# Patient Record
Sex: Female | Born: 1970 | Race: White | Hispanic: No | Marital: Married | Smoking: Never smoker
Health system: Southern US, Community
[De-identification: ages and names within clinical notes are randomized; demographics above are authoritative.]

## PROBLEM LIST (undated history)

## (undated) DIAGNOSIS — Z8742 Personal history of other diseases of the female genital tract: Secondary | ICD-10-CM

## (undated) HISTORY — PX: WISDOM TOOTH EXTRACTION: SHX21

## (undated) HISTORY — PX: INTRAUTERINE DEVICE (IUD) INSERTION: SHX5877

## (undated) HISTORY — PX: TONSILLECTOMY: SUR1361

## (undated) HISTORY — PX: COLPOSCOPY: SHX161

## (undated) HISTORY — DX: Personal history of other diseases of the female genital tract: Z87.42

---

## 2005-09-12 ENCOUNTER — Emergency Department: Payer: Self-pay | Admitting: Emergency Medicine

## 2006-05-21 ENCOUNTER — Ambulatory Visit: Payer: Self-pay | Admitting: Licensed Clinical Social Worker

## 2006-05-28 ENCOUNTER — Ambulatory Visit: Payer: Self-pay | Admitting: Licensed Clinical Social Worker

## 2006-06-04 ENCOUNTER — Ambulatory Visit: Payer: Self-pay | Admitting: Licensed Clinical Social Worker

## 2006-06-11 ENCOUNTER — Ambulatory Visit: Payer: Self-pay | Admitting: Licensed Clinical Social Worker

## 2006-07-02 ENCOUNTER — Ambulatory Visit: Payer: Self-pay | Admitting: Licensed Clinical Social Worker

## 2006-07-11 ENCOUNTER — Ambulatory Visit: Payer: Self-pay | Admitting: Licensed Clinical Social Worker

## 2006-07-17 ENCOUNTER — Ambulatory Visit: Payer: Self-pay | Admitting: Licensed Clinical Social Worker

## 2007-06-15 ENCOUNTER — Other Ambulatory Visit: Payer: Self-pay

## 2007-06-15 ENCOUNTER — Emergency Department: Payer: Self-pay | Admitting: Emergency Medicine

## 2007-08-22 ENCOUNTER — Other Ambulatory Visit: Admission: RE | Admit: 2007-08-22 | Discharge: 2007-08-22 | Payer: Self-pay | Admitting: Obstetrics & Gynecology

## 2007-10-03 ENCOUNTER — Emergency Department: Payer: Self-pay | Admitting: Emergency Medicine

## 2009-01-13 ENCOUNTER — Other Ambulatory Visit: Admission: RE | Admit: 2009-01-13 | Discharge: 2009-01-13 | Payer: Self-pay | Admitting: Obstetrics and Gynecology

## 2010-10-12 ENCOUNTER — Encounter
Admission: RE | Admit: 2010-10-12 | Discharge: 2010-10-12 | Payer: Self-pay | Source: Home / Self Care | Attending: Obstetrics and Gynecology | Admitting: Obstetrics and Gynecology

## 2013-08-11 ENCOUNTER — Encounter (HOSPITAL_COMMUNITY): Payer: Self-pay | Admitting: Emergency Medicine

## 2013-08-11 ENCOUNTER — Emergency Department (INDEPENDENT_AMBULATORY_CARE_PROVIDER_SITE_OTHER)
Admission: EM | Admit: 2013-08-11 | Discharge: 2013-08-11 | Disposition: A | Discharge status: Home / Self Care | Payer: Self-pay | Source: Home / Self Care | Attending: Emergency Medicine | Admitting: Emergency Medicine

## 2013-08-11 DIAGNOSIS — L089 Local infection of the skin and subcutaneous tissue, unspecified: Secondary | ICD-10-CM

## 2013-08-11 MED ORDER — SULFAMETHOXAZOLE-TRIMETHOPRIM 800-160 MG PO TABS: 1.0000 | ORAL_TABLET | Freq: Two times a day (BID) | ORAL | Status: DC

## 2013-08-11 NOTE — ED Notes (Signed)
C/o left great toe infection x 1wk. Swelling and redness. Pt has used neosporin and peroxide with no relief.

## 2013-08-11 NOTE — ED Provider Notes (Signed)
CSN: [id number]     Arrival date & time 08/11/13  0907 History   First MD Initiated Contact with Patient 08/11/13 [phone]     Chief Complaint  Patient presents with  . Recurrent Skin Infections    infection of left great toe.    (Consider location/radiation/quality/duration/timing/severity/associated sxs/prior Treatment) Patient is a 42 y.o. female presenting with toe pain. The history is provided by the patient. No language interpreter was used.  Toe Pain This is a new problem. The problem occurs constantly. The problem has been gradually worsening. Nothing aggravates the symptoms. Nothing relieves the symptoms. She has tried water for the symptoms. The treatment provided no relief.  Pt complains of infection to left 1st toe  History reviewed. No pertinent past medical history. History reviewed. No pertinent past surgical history. History reviewed. No pertinent family history. History  Substance Use Topics  . Smoking status: Never Smoker   . Smokeless tobacco: Not on file  . Alcohol Use: Yes   OB History   Grav Para Term Preterm Abortions TAB SAB Ect Mult Living                 Review of Systems  Skin: Positive for wound.  All other systems reviewed and are negative.    Allergies  Review of patient's allergies indicates no known allergies.  Home Medications  No current outpatient prescriptions on file. BP 142/84  Pulse 77  Temp(Src) 97.7 F (36.5 C) (Oral)  Resp 14  SpO2 10%  LMP 08/11/2013 Physical Exam  Nursing note and vitals reviewed. Constitutional: She appears well-developed and well-nourished.  Musculoskeletal: She exhibits tenderness.  Swollen right lateral 1st toe  Neurological: She is alert.  Skin: Skin is warm.    ED Course  Procedures (including critical care time) Labs Review Labs Reviewed - No data to display Imaging Review No results found.  EKG Interpretation     Ventricular Rate:    PR Interval:    QRS Duration:   QT Interval:     QTC Calculation:   R Axis:     Text Interpretation:              MDM   1. Toe infection     Soak 20 minutes 4 times a day,  Bactrim     Elson Areas, PA-C 08/11/13 1008  [employer], [place] 08/11/13 1008

## 2013-08-13 NOTE — ED Provider Notes (Signed)
Medical screening examination/treatment/procedure(s) were performed by non-physician practitioner and as supervising physician I was immediately available for consultation/collaboration.  Shaolin Armas, M.D.  Yurianna Tusing C Buena Boehm, MD 08/13/13 1136 

## 2013-08-18 ENCOUNTER — Encounter: Payer: Self-pay | Admitting: Podiatry

## 2013-08-18 ENCOUNTER — Ambulatory Visit (INDEPENDENT_AMBULATORY_CARE_PROVIDER_SITE_OTHER): Payer: Self-pay | Admitting: Podiatry

## 2013-08-18 VITALS — BP 130/85 | HR 74 | Resp 24 | Ht 67.0 in | Wt 130.0 lb

## 2013-08-18 DIAGNOSIS — L600 Ingrowing nail: Secondary | ICD-10-CM

## 2013-08-18 MED ORDER — HYDROCODONE-ACETAMINOPHEN 10-325 MG PO TABS: 1.0000 | ORAL_TABLET | Freq: Three times a day (TID) | ORAL | Status: DC | PRN

## 2013-08-18 NOTE — Progress Notes (Signed)
  Subjective:    Patient ID: Natalie Weber, female    DOB: 02-02-1971, 42 y.o.   MRN: [id number] "I have an infection on my left big toenail."   HPI Comments: N  Throb, ache, tender, sore, pus, and bleeding L   Ingrown hallux left D  2 weeks O  Suddenly, I had a pedicure C  Gotten little better A  Shoes T  Sulfamethoxazole, soaking, neosporin, bandaids      Review of Systems  Constitutional: Negative.   HENT: Negative.   Eyes: Negative.   Respiratory: Negative.   Cardiovascular: Negative.   Gastrointestinal: Negative.   Endocrine: Negative.   Genitourinary: Negative.   Musculoskeletal: Negative.   Skin: Negative.   Allergic/Immunologic: Negative.   Neurological: Negative.   Hematological: Negative.   Psychiatric/Behavioral: Negative.        Objective:   Physical Exam 42 year old white female orientated x3 presents with her boyfriend.  Vascular: The DP and PT pulses are two over four bilaterally capillary fill is immediate bilaterally.  Neurological: Sensation intact bilaterally  Dermatological: Incurvation of the medial margin of the left hallux toenail, with low-grade erythema edema.  Musculoskeletal: No deformities noted bilaterally        Assessment & Plan:   Assessment: Ingrowing medial margin of the left hallux toenail with low-grade paronychia.  Plan: Offered patient permanent removal of the margin on the left hallux toenail, she verbally consents to the procedure. The left hallux was then blocked with 3 cc 50-50 mixture of 2% plain Xylocaine and 0.5% plain Marcaine. The left hallux was prepped with Betadine and exsanguinated. The medial margin of the left hallux toenail was excised and a phenol matricectomy performed. An antibiotic dressing was applied. The tourniquet was released and spontaneous capillary filling time noted on the left hallux.  Postoperative oral redness structures provided. Hydrocodone 10 mg/325 dispense #10. Take one every 8  hours as needed for pain. Reappoint at patient's request.  Richard C.Leeanne Deed, DPM

## 2013-08-18 NOTE — Patient Instructions (Signed)

## 2014-01-28 ENCOUNTER — Other Ambulatory Visit: Payer: Self-pay

## 2014-01-28 ENCOUNTER — Encounter: Payer: Self-pay | Admitting: Certified Nurse Midwife

## 2014-01-28 ENCOUNTER — Ambulatory Visit (INDEPENDENT_AMBULATORY_CARE_PROVIDER_SITE_OTHER): Payer: No Typology Code available for payment source | Admitting: Certified Nurse Midwife

## 2014-01-28 VITALS — BP 118/80 | HR 80 | Ht 66.0 in | Wt 134.0 lb

## 2014-01-28 DIAGNOSIS — Z1231 Encounter for screening mammogram for malignant neoplasm of breast: Secondary | ICD-10-CM

## 2014-01-28 DIAGNOSIS — Z0000 Encounter for general adult medical examination without abnormal findings: Secondary | ICD-10-CM

## 2014-01-28 DIAGNOSIS — Z01419 Encounter for gynecological examination (general) (routine) without abnormal findings: Secondary | ICD-10-CM

## 2014-01-28 LAB — LIPID PANEL
CHOL/HDL RATIO: 2.1 ratio
CHOLESTEROL: 169 mg/dL (ref 0–200)
HDL: 79 mg/dL (ref >39)
LDL Cholesterol: 47 mg/dL (ref 0–99)
TRIGLYCERIDES: 213 mg/dL — AB (ref <150)
VLDL: 43 mg/dL — AB (ref 0–40)

## 2014-01-28 LAB — CBC
HCT: 40.2 % (ref 36.0–46.0)
HEMOGLOBIN: 13.8 g/dL (ref 12.0–15.0)
MCH: 31.4 pg (ref 26.0–34.0)
MCHC: 34.3 g/dL (ref 30.0–36.0)
MCV: 91.6 fL (ref 78.0–100.0)
PLATELETS: 239 10*3/uL (ref 150–400)
RBC: 4.39 MIL/uL (ref 3.87–5.11)
RDW: 13.6 % (ref 11.5–15.5)
WBC: 5.5 10*3/uL (ref 4.0–10.5)

## 2014-01-28 LAB — COMPREHENSIVE METABOLIC PANEL
ALK PHOS: 51 U/L (ref 39–117)
ALT: 10 U/L (ref 0–35)
AST: 14 U/L (ref 0–37)
Albumin: 4.3 g/dL (ref 3.5–5.2)
BILIRUBIN TOTAL: 0.5 mg/dL (ref 0.2–1.2)
BUN: 13 mg/dL (ref 6–23)
CO2: 27 mEq/L (ref 19–32)
CREATININE: 0.72 mg/dL (ref 0.50–1.10)
Calcium: 9.6 mg/dL (ref 8.4–10.5)
Chloride: 101 mEq/L (ref 96–112)
Glucose, Bld: 68 mg/dL — ABNORMAL LOW (ref 70–99)
POTASSIUM: 4.1 meq/L (ref 3.5–5.3)
SODIUM: 138 meq/L (ref 135–145)
Total Protein: 6.7 g/dL (ref 6.0–8.3)

## 2014-01-28 LAB — POCT URINALYSIS DIPSTICK
BILIRUBIN UA: NEGATIVE
Blood, UA: NEGATIVE
GLUCOSE UA: NEGATIVE
KETONES UA: NEGATIVE
LEUKOCYTES UA: NEGATIVE
Nitrite, UA: NEGATIVE
PH UA: 8
Protein, UA: NEGATIVE
Urobilinogen, UA: NEGATIVE

## 2014-01-28 NOTE — Progress Notes (Signed)
Reviewed personally.  M. Suzanne Khristi Schiller, MD.  

## 2014-01-28 NOTE — Patient Instructions (Addendum)

## 2014-01-28 NOTE — Progress Notes (Signed)
Patient ID: Natalie Weber, female   DOB: [birth year], 43 y.o.   MRN: [id number] 43 y.o. G70P3003 Married Caucasian Fe here for annual exam. Periods normal, no issues. Contraception none. Would like to discuss options. No health issues over past year. Sees Urgent Care prn. No health issues today.   No LMP recorded.          Sexually active: yes  The current method of family planning is none.    Exercising: yes  Home exercise routine includes running and walking. Smoker:  no  Health Maintenance: Pap:  2 years ago MMG:  10/14/10, Bi-Rads 1: negative TDaP:  UTD Labs: HB:  Declined Urine:  Negative    reports that she has never smoked. She has never used smokeless tobacco. She reports that she drinks about 1.5 ounces of alcohol per week. She reports that she does not use illicit drugs.  Past Medical History  Diagnosis Date  . History of abnormal cervical Pap smear     colpo    Past Surgical History  Procedure Laterality Date  . Tonsillectomy    . Wisdom tooth extraction    . Colposcopy      No current outpatient prescriptions on file.   No current facility-administered medications for this visit.    Family History  Problem Relation Age of Onset  . Hypertension Mother   . Hypertension Father     ROS:  Pertinent items are noted in HPI.  Otherwise, a comprehensive ROS was negative.  Exam:   BP 118/80  Pulse 80  Ht 5\' 6"  (1.676 m)  Wt 134 lb (60.782 kg)  BMI 21.64 kg/m2 Height: 5\' 6"  (167.6 cm)  Ht Readings from Last 3 Encounters:  01/28/14 5\' 6"  (1.676 m)  08/18/13 5\' 7"  (1.702 m)    General appearance: alert, cooperative and appears stated age Head: Normocephalic, without obvious abnormality, atraumatic Neck: no adenopathy, supple, symmetrical, trachea midline and thyroid normal to inspection and palpation and non-palpable Lungs: clear to auscultation bilaterally Breasts: normal appearance, no masses or tenderness, No nipple retraction or dimpling, No nipple  discharge or bleeding, No axillary or supraclavicular adenopathy Heart: regular rate and rhythm Abdomen: soft, non-tender; no masses,  no organomegaly Extremities: extremities normal, atraumatic, no cyanosis or edema Skin: Skin color, texture, turgor normal. No rashes or lesions Lymph nodes: Cervical, supraclavicular, and axillary nodes normal. No abnormal inguinal nodes palpated Neurologic: Grossly normal   Pelvic: External genitalia:  no lesions              Urethra:  normal appearing urethra with no masses, tenderness or lesions              Bartholin's and Skene's: normal                 Vagina: normal appearing vagina with normal color and discharge, no lesions              Cervix: normal, non tender              Pap taken: yes Bimanual Exam:  Uterus:  normal size, contour, position, consistency, mobility, non-tender and anteverted              Adnexa: normal adnexa and no mass, fullness, tenderness               Rectovaginal: Confirms               Anus:  normal sphincter tone, no lesions  A:  Well Woman with  normal exam  Contraception withdrawal and NFP, interested in Mirena IUD  Screening labs  P:   Reviewed health and wellness pertinent to exam  Discussed risks and benefits of Mirena IUD, insertion, removal, bleeding profile. Given info to check with insurance company and if decides for insertion will need to use condoms month prior and call to let us know she desires IUD for precert. She will need to call on menses for insertion.   Labs:CBC,CMP,Lipid panel,TSH  Pap smear as per guidelines   Mammogram yearly, information given to schedule pap smear taken today with HPVHR  counseled on breast self exam, mammography screening, family planning choices, adequate intake of calcium and vitamin D, diet and exercise  return annually or prn  An After Visit Summary was printed and given to the patient.

## 2014-01-29 ENCOUNTER — Other Ambulatory Visit: Payer: Self-pay | Admitting: Certified Nurse Midwife

## 2014-01-29 DIAGNOSIS — R6889 Other general symptoms and signs: Secondary | ICD-10-CM

## 2014-01-29 LAB — TSH: TSH: 1.595 u[IU]/mL (ref 0.350–4.500)

## 2014-02-02 LAB — IPS PAP TEST WITH HPV

## 2014-02-03 ENCOUNTER — Telehealth: Payer: Self-pay | Admitting: Emergency Medicine

## 2014-02-03 ENCOUNTER — Other Ambulatory Visit: Payer: Self-pay | Admitting: Certified Nurse Midwife

## 2014-02-03 DIAGNOSIS — IMO0002 Reserved for concepts with insufficient information to code with codable children: Secondary | ICD-10-CM

## 2014-02-03 DIAGNOSIS — R8789 Other abnormal findings in specimens from female genital organs: Secondary | ICD-10-CM

## 2014-02-03 DIAGNOSIS — R87618 Other abnormal cytological findings on specimens from cervix uteri: Secondary | ICD-10-CM

## 2014-02-03 NOTE — Telephone Encounter (Signed)
Message copied by Joeseph AmorFAST, Korde Jeppsen L on Tue Feb 03, 2014 11:29 AM ------      Message from: Verner CholLEONARD, DEBORAH S      Created: Tue Feb 03, 2014  8:24 AM       Notify patient her pap smear showed HPVHR detected. Colposcopy recommended for evaluation. She has had previous colposcopy for abnormal pap in past.      Order in ------

## 2014-02-03 NOTE — Telephone Encounter (Signed)
Mailed the In-Office procedure form that includes appointment date and time, patient copay, and cancellation policy. °

## 2014-02-03 NOTE — Telephone Encounter (Signed)
Spoke with patient and message from Verner Choleborah S. Leonard CNM given. Patient verbalized understanding and agreeable to colposcopy.  Not currently on any birth control and states she is sexually active. Expecting her cycle to be the last week in April. Scheduled colposcopy for 02/26/14 (patient requests AM appointment) Procedure time blocked. Colposcopy pre-procedure instructions given. Motrin instructions given. Motrin=Advil=Ibuprofen Can take 800 mg (Can purchase over the counter, you will need four 200 mg pills) every 8 hours as needed.  Take with food. Make sure to eat a meal before appointment and drink plenty of fluids. Patient verbalized understanding and will call to reschedule if will be on menses or has any concerns regarding pregnancy. Advised will need to cancel within 24 hours or will have $100.00 no show fee placed to account. Patient agreeable and verbalized understanding to instructions.   Routing to provider for final review. Patient agreeable to disposition. Will close encounter

## 2014-02-25 ENCOUNTER — Ambulatory Visit
Admission: RE | Admit: 2014-02-25 | Discharge: 2014-02-25 | Disposition: A | Discharge status: Home / Self Care | Payer: No Typology Code available for payment source | Source: Ambulatory Visit

## 2014-02-25 DIAGNOSIS — Z1231 Encounter for screening mammogram for malignant neoplasm of breast: Secondary | ICD-10-CM

## 2014-02-26 ENCOUNTER — Ambulatory Visit (INDEPENDENT_AMBULATORY_CARE_PROVIDER_SITE_OTHER): Payer: No Typology Code available for payment source | Admitting: Certified Nurse Midwife

## 2014-02-26 ENCOUNTER — Encounter: Payer: Self-pay | Admitting: Certified Nurse Midwife

## 2014-02-26 VITALS — BP 118/64 | HR 68 | Resp 16 | Ht 66.0 in | Wt 132.0 lb

## 2014-02-26 DIAGNOSIS — B977 Papillomavirus as the cause of diseases classified elsewhere: Secondary | ICD-10-CM

## 2014-02-26 DIAGNOSIS — R8789 Other abnormal findings in specimens from female genital organs: Secondary | ICD-10-CM

## 2014-02-26 DIAGNOSIS — Z0189 Encounter for other specified special examinations: Secondary | ICD-10-CM

## 2014-02-26 DIAGNOSIS — R87618 Other abnormal cytological findings on specimens from cervix uteri: Secondary | ICD-10-CM

## 2014-02-26 LAB — POCT URINE PREGNANCY: PREG TEST UR: NEGATIVE

## 2014-02-26 NOTE — Patient Instructions (Signed)

## 2014-02-26 NOTE — Progress Notes (Signed)
43 y.o. Divorced Caucasian female [phone] here for colposcopy exam. Patient sexually active with no protection. LMP 4/23 or 24/15. Last sexual activity was 02/25/14 without protection. She is currently day 13 or 14 of 28 day cycle. Discussed concerns with colposcopy exam and possible pregnancy. Patient had planned IUD, but needed colpo first. Patient will reschedule today and will use consistent contraception or abstinence. Patient will be called to reschedule. Patient voiced understanding.  O: Healthy WD,WN female Affect: normal   A:Pap smear normal with +HPVHR, past history of abnormal history with Cryo Unprotected sexual activity at ovulatory time in cycle unable to do colpo   P: Patient will follow same instructions and will keep appointment for evaluation. IUD to be scheduled.  RV, appointment 03/24/14 @ 2pm

## 2014-02-26 NOTE — Telephone Encounter (Signed)
Per Verner Choleborah S. Leonard CNM to r/s patient for colpscopy for 03/24/14. Patient scheduled for procedure at 1400. States she understands instructions.

## 2014-02-26 NOTE — Addendum Note (Signed)
Addended by: Joeseph AmorFAST, Leatta Alewine L on: 02/26/2014 12:06 PM   Modules accepted: Orders

## 2014-02-26 NOTE — Progress Notes (Signed)
Reviewed personally.  M. Suzanne Rito Lecomte, MD.  

## 2014-02-26 NOTE — Progress Notes (Signed)
Pt took 400mg  ibuprofen at 9am. Pt had previous colpo done. 01-28-14 negative pap with +HPV

## 2014-03-04 ENCOUNTER — Other Ambulatory Visit: Payer: No Typology Code available for payment source

## 2014-03-04 DIAGNOSIS — R6889 Other general symptoms and signs: Secondary | ICD-10-CM

## 2014-03-05 LAB — TRIGLYCERIDES: Triglycerides: 102 mg/dL (ref <150)

## 2014-03-23 ENCOUNTER — Telehealth: Payer: Self-pay | Admitting: Certified Nurse Midwife

## 2014-03-23 NOTE — Telephone Encounter (Signed)
Patient called to reschedule colpo due to being on her cycle. Rescheduled for 06/08

## 2014-03-24 ENCOUNTER — Ambulatory Visit: Payer: No Typology Code available for payment source | Admitting: Certified Nurse Midwife

## 2014-03-26 NOTE — Telephone Encounter (Signed)
Returning a call to Tracy °

## 2014-03-26 NOTE — Telephone Encounter (Signed)
Patient will be in court on Monday morning, 03/30/14, so she needs to reschedule her colposcopy (no penalty per SY). Please call patient to reschedule. She can come 03/27/14 or later in the day 03/30/14 if there's anything available.

## 2014-03-26 NOTE — Telephone Encounter (Signed)
Spoke with patient. She would like to r/s her colposcopy appointment due to having court on the morning of 6/8. She started her cycle on 5/30 and is now complete, states she is not having any bleeding.  Scheduled colposcopy with Verner Chol CNM for 03/27/14 at 1115 (time okay with Verner Chol CNM). Scheduled procedure room.  Patient states she is aware of pre-procedure instructions. Will take motrin 800 mg prior and eat a meal and increase liquids. Aware of appointment and agreeable.   Routing to provider for final review. Patient agreeable to disposition. Will close encounter

## 2014-03-26 NOTE — Telephone Encounter (Signed)
Message left to return call to Natalie Weber at [phone].   Patient to reschedule colposcopy.  Patient is not on contraception. Will need to co-ordinate colposcopy with her cycle as last time for colpo was cancelled due to being on cycle.

## 2014-03-27 ENCOUNTER — Encounter: Payer: Self-pay | Admitting: Certified Nurse Midwife

## 2014-03-27 ENCOUNTER — Ambulatory Visit (INDEPENDENT_AMBULATORY_CARE_PROVIDER_SITE_OTHER): Payer: No Typology Code available for payment source | Admitting: Certified Nurse Midwife

## 2014-03-27 VITALS — BP 120/80 | HR 84 | Resp 18 | Ht 66.0 in | Wt 136.0 lb

## 2014-03-27 DIAGNOSIS — R87810 Cervical high risk human papillomavirus (HPV) DNA test positive: Secondary | ICD-10-CM

## 2014-03-27 DIAGNOSIS — R8789 Other abnormal findings in specimens from female genital organs: Secondary | ICD-10-CM

## 2014-03-27 DIAGNOSIS — Z309 Encounter for contraceptive management, unspecified: Secondary | ICD-10-CM

## 2014-03-27 DIAGNOSIS — Z5309 Procedure and treatment not carried out because of other contraindication: Secondary | ICD-10-CM

## 2014-03-27 LAB — POCT URINE PREGNANCY: PREG TEST UR: NEGATIVE

## 2014-03-27 NOTE — Progress Notes (Signed)
Hx of neg pap with +HPV HR on pap 4/15 Patient had 600 mg Ibuprofen this morning.  Upt neg

## 2014-03-27 NOTE — Progress Notes (Signed)
Patient ID: [name], female   DOB: 04/06/1971, 43 y.o.   MRN: [id number]  Chief Complaint  Patient presents with  . Colposcopy    HPI Natalie Weber is a 43 y.o. female married  [id number] white, here for colposcopy exam. Denies vaginal bleeding or pain. Consistent condom use since last period.  HPI  Indications: Pap smear on 4/8 2015 showed: Positive HPVHR. Previous colposcopy: 2006 and in 2010. Prior cervical treatment: cryosurgery.  Past Medical History  Diagnosis Date  . History of abnormal cervical Pap smear     previous colpo, 01-28-14 neg pap +HPV HR    Past Surgical History  Procedure Laterality Date  . Tonsillectomy    . Wisdom tooth extraction    . Colposcopy      Family History  Problem Relation Age of Onset  . Hypertension Mother   . Hypertension Father     Social History History  Substance Use Topics  . Smoking status: Never Smoker   . Smokeless tobacco: Never Used  . Alcohol Use: 1.5 oz/week    3 drink(s) per week     Comment: occasionally    No Known Allergies  No current outpatient prescriptions on file.   No current facility-administered medications for this visit.    Review of Systems Review of Systems  Constitutional: Negative.   Genitourinary: Negative for vaginal bleeding, vaginal discharge and vaginal pain.    Blood pressure 120/80, pulse 84, resp. rate 18, height 5\' 6"  (1.676 m), weight 136 lb (61.689 kg), last menstrual period 03/21/2014.  Physical Exam Physical Exam  Constitutional: She is oriented to person, place, and time. She appears well-developed and well-nourished.  Genitourinary: Vagina normal. No bleeding around the vagina. No vaginal discharge found.    Neurological: She is alert and oriented to person, place, and time.  Skin: Skin is warm and dry.  Psychiatric: She has a normal mood and affect. Her behavior is normal. Judgment normal.    Data Reviewed Reviewed pap smear results and consent form. Questions  addressed.  Assessment   History of +HPVHR with previous cryo for ? Etiology here for colposcopy exam Procedure Details  The risks and benefits of the procedure and Written informed consent obtained.  Speculum placed in vagina and excellent visualization of cervix achieved, cervix swabbed x 3 with saline and with acetic acid solution. Cervix viewed with 3.75,7.5,15# and green filter with acetowhite response noted at 7 o'clock and inside cervical os. Lugol's applied with non staining noted in same area. Biopsy taken at 7 o'clock. ECC taken including tiny area noted inside cervical os. Monsel's applied. No bleeding noted on removal of speculum. Patient tolerated procedure well. Instructions given. Patient given coke to drink after due to feeling fatigued. Ambulated with assistance and "feeling fine".  Specimens: 2  Complications: none.     Plan    Specimens labelled and sent to Pathology. Patient will be notified of results once reviewed. 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  ECC showed benign endocervical glands, negative for atypia or malignancy. Patient to be notified of results and need for follow up in one year for pap smear. Pap recall 08  Verner Chol 03/27/2014, 12:00 PM

## 2014-03-27 NOTE — Patient Instructions (Signed)

## 2014-03-30 ENCOUNTER — Ambulatory Visit: Payer: No Typology Code available for payment source | Admitting: Certified Nurse Midwife

## 2014-03-31 LAB — IPS OTHER TISSUE BIOPSY

## 2014-04-04 NOTE — Progress Notes (Signed)
Reviewed personally.  M. Suzanne Philena Obey, MD.  

## 2014-04-08 ENCOUNTER — Telehealth: Payer: Self-pay | Admitting: Certified Nurse Midwife

## 2014-04-08 NOTE — Telephone Encounter (Signed)
Patient is calling asking for results from biopsy on 03/27/14

## 2014-04-08 NOTE — Telephone Encounter (Signed)
Spoke with patient. Results given. Patient agreeable and verbalizes understanding. AEX 02/23/2015.    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

## 2014-08-24 ENCOUNTER — Encounter: Payer: Self-pay | Admitting: Certified Nurse Midwife

## 2014-09-30 ENCOUNTER — Encounter: Payer: Self-pay | Admitting: Certified Nurse Midwife

## 2014-12-10 ENCOUNTER — Telehealth: Payer: Self-pay | Admitting: Certified Nurse Midwife

## 2014-12-10 NOTE — Telephone Encounter (Signed)
Left message regarding upcoming appointment has been canceled and needs to be rescheduled. °

## 2015-02-23 ENCOUNTER — Ambulatory Visit: Payer: No Typology Code available for payment source | Admitting: Certified Nurse Midwife

## 2015-06-03 ENCOUNTER — Telehealth: Payer: Self-pay | Admitting: *Deleted

## 2015-06-03 NOTE — Telephone Encounter (Signed)
08 Pap recall due 02/2015 due to LGSIL with HPV effect on previous colpo  Past History:   03/27/14 Colpo, LGSIL with HPV effect 01/28/14 Pap, Negative with + HR HPV Previous abnormal with history of cryo per office note, no other history available in EPIC.  No paper chart available.  Pt has not scheduled AEX with Lovett Sox, CNM.  Please call pt to schedule AEX.  Thank you.

## 2015-06-03 NOTE — Telephone Encounter (Signed)
Left Message To Call Back  

## 2015-06-09 NOTE — Telephone Encounter (Signed)
Left Message To Call Back  

## 2015-06-11 NOTE — Telephone Encounter (Signed)
Called patient x2 no callback 

## 2015-06-14 ENCOUNTER — Other Ambulatory Visit: Payer: Self-pay

## 2015-06-14 ENCOUNTER — Telehealth: Payer: Self-pay | Admitting: Certified Nurse Midwife

## 2015-06-14 DIAGNOSIS — N644 Mastodynia: Secondary | ICD-10-CM

## 2015-06-14 NOTE — Telephone Encounter (Signed)
Patient called to schedule a MMG at [facility] and was told she needed to contact our office for a referral.

## 2015-06-14 NOTE — Telephone Encounter (Signed)
Spoke with patient. Patient states that she called to schedule her mammogram and was told she would need an order. States she has been experiencing soreness/tenderness around her nipple off an on for a couple of weeks. Denies any swelling, redness, or warmth to the breast. Offered to schedule breast check appointment in office for patient. Patient declines stating she would like to wait until her appointment on 06/22/2015 for her aex with Verner Chol CNM. Advised if her symptoms worsen or develops new symptoms will need to be seen earlier with our office. Patient is agreeable.  Routing to provider for final review. Patient agreeable to disposition. Will close encounter.   Patient aware provider will review message and nurse will return call if any additional advice or change of disposition.

## 2015-06-15 ENCOUNTER — Encounter: Payer: Self-pay | Admitting: *Deleted

## 2015-06-15 NOTE — Telephone Encounter (Signed)
Annual exam scheduled for 06/16/15.  Recall extended. Routing to provider for final review.  Closing encounter.

## 2015-06-16 ENCOUNTER — Ambulatory Visit (INDEPENDENT_AMBULATORY_CARE_PROVIDER_SITE_OTHER): Payer: No Typology Code available for payment source | Admitting: Certified Nurse Midwife

## 2015-06-16 ENCOUNTER — Encounter: Payer: Self-pay | Admitting: Certified Nurse Midwife

## 2015-06-16 VITALS — BP 110/64 | HR 68 | Resp 16 | Ht 66.25 in | Wt 134.0 lb

## 2015-06-16 DIAGNOSIS — N644 Mastodynia: Secondary | ICD-10-CM | POA: Diagnosis not present

## 2015-06-16 DIAGNOSIS — Z0000 Encounter for general adult medical examination without abnormal findings: Secondary | ICD-10-CM

## 2015-06-16 DIAGNOSIS — Z124 Encounter for screening for malignant neoplasm of cervix: Secondary | ICD-10-CM | POA: Diagnosis not present

## 2015-06-16 DIAGNOSIS — Z01419 Encounter for gynecological examination (general) (routine) without abnormal findings: Secondary | ICD-10-CM | POA: Diagnosis not present

## 2015-06-16 LAB — POCT URINALYSIS DIPSTICK
Bilirubin, UA: NEGATIVE
Blood, UA: NEGATIVE
Glucose, UA: NEGATIVE
Ketones, UA: NEGATIVE
LEUKOCYTES UA: NEGATIVE
NITRITE UA: NEGATIVE
PH UA: 5
PROTEIN UA: NEGATIVE
UROBILINOGEN UA: NEGATIVE

## 2015-06-16 NOTE — Progress Notes (Signed)
Scheduled patient while in office for bilateral mammogram and left breast ultrasound at [facility] on 8/31 at 9:40am. Patient is agreeable to date and time. 6 week follow up appointment with Verner Chol CNM scheduled for 07/20/2015 at 10am. Agreeable to date and time.

## 2015-06-16 NOTE — Patient Instructions (Signed)

## 2015-06-16 NOTE — Progress Notes (Addendum)
44 y.o. G30P3003 Divorced  Caucasian Fe here for annual exam. Periods normal,no issues, very light. Contraception Liletta IUD inserted 5/16 at [facility] working well. Complaining  With left breast tenderness since 5/16, worse with period and but continues. Minimal caffeine use. Denies injury, or change in bra or skin change or nipple discharge.  Sees PCP prn. No other health issues today. Oldest son started college today!  Patient's last menstrual period was 05/21/2015.          Sexually active: Yes.    The current method of family planning is IUD.    Exercising: Yes.    walking Smoker:  no  Health Maintenance: Pap: 01-28-14 neg HPV HR +, colpo 03-27-14 LGSIL, ECC neg MMG: 02-25-14 category c density,birads 1:neg Colonoscopy:  none BMD:   none TDaP: yrs ago declines today Labs: poct urine-neg Self breast exam: done occ   reports that she has never smoked. She has never used smokeless tobacco. She reports that she drinks about 1.8 oz of alcohol per week. She reports that she does not use illicit drugs.  Past Medical History  Diagnosis Date  . History of abnormal cervical Pap smear     previous colpo, 01-28-14 neg pap +HPV HR    Past Surgical History  Procedure Laterality Date  . Tonsillectomy    . Wisdom tooth extraction    . Colposcopy      No current outpatient prescriptions on file.   No current facility-administered medications for this visit.    Family History  Problem Relation Age of Onset  . Hypertension Mother   . Hypertension Father     ROS:  Pertinent items are noted in HPI.  Otherwise, a comprehensive ROS was negative.  Exam:   BP 110/64 mmHg  Pulse 68  Resp 16  Ht 5' 6.25" (1.683 m)  Wt 134 lb (60.782 kg)  BMI 21.46 kg/m2  LMP 05/21/2015 Height: 5' 6.25" (168.3 cm) Ht Readings from Last 3 Encounters:  06/16/15 5' 6.25" (1.683 m)  03/27/14  (1.676 m)  02/26/14  (1.676 m)    General appearance: alert, cooperative and appears stated  age Head: Normocephalic, without obvious abnormality, atraumatic Neck: no adenopathy, supple, symmetrical, trachea midline and thyroid normal to inspection and palpation Lungs: clear to auscultation bilaterally Breasts: normal appearance, no masses or tenderness, No nipple retraction or dimpling, No nipple discharge or bleeding, No axillary or supraclavicular adenopathy Heart: regular rate and rhythm Abdomen: soft, non-tender; no masses,  no organomegaly Extremities: extremities normal, atraumatic, no cyanosis or edema Skin: Skin color, texture, turgor normal. No rashes or lesions Lymph nodes: Cervical, supraclavicular, and axillary nodes normal. No abnormal inguinal nodes palpated Neurologic: Grossly normal   Pelvic: External genitalia:  no lesions              Urethra:  normal appearing urethra with no masses, tenderness or lesions              Bartholin's and Skene's: normal                 Vagina: normal appearing vagina with normal color and discharge, no lesions              Cervix: normal,non tender, no lesions              Pap taken: Yes.   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  Aware of warning signs IUD and will advise  Discussed need for evaluation with diagnostic mammogram and [place] is due for regular screening also. Patient agreeable, will be scheduled prior to leaving today.  Pap smear as above, if negative repeat in one year, or per results.   counseled on breast self exam,  mammography screening, adequate intake of calcium and vitamin D, diet and exercise  return annually or prn  An After Visit Summary was printed and given to the patient.

## 2015-06-18 ENCOUNTER — Other Ambulatory Visit: Payer: Self-pay | Admitting: Certified Nurse Midwife

## 2015-06-18 DIAGNOSIS — R87810 Cervical high risk human papillomavirus (HPV) DNA test positive: Principal | ICD-10-CM

## 2015-06-18 DIAGNOSIS — R87610 Atypical squamous cells of undetermined significance on cytologic smear of cervix (ASC-US): Secondary | ICD-10-CM

## 2015-06-18 LAB — IPS PAP TEST WITH HPV

## 2015-06-18 NOTE — Addendum Note (Signed)
Addended by: Verner Chol on: 06/18/2015 07:50 AM   Modules accepted: Kipp Brood

## 2015-06-18 NOTE — Progress Notes (Signed)
FYI--Liletta is currently only FDA approved for 3 year usage for contraception.  May want to addend note.  Reviewed personally.  Lum Keas, MD.

## 2015-06-21 ENCOUNTER — Other Ambulatory Visit: Payer: Self-pay | Admitting: Nurse Practitioner

## 2015-06-21 ENCOUNTER — Telehealth: Payer: Self-pay | Admitting: Emergency Medicine

## 2015-06-21 ENCOUNTER — Other Ambulatory Visit: Payer: Self-pay | Admitting: Obstetrics & Gynecology

## 2015-06-21 DIAGNOSIS — N644 Mastodynia: Secondary | ICD-10-CM

## 2015-06-21 NOTE — Telephone Encounter (Signed)
Message left to return call to [place] at [phone].   Patient with Liletta IUD.

## 2015-06-21 NOTE — Telephone Encounter (Signed)
-----   Message from Verner Chol, CNM sent at 06/18/2015  5:08 PM EDT ----- Pap smear ASCUS with HPVHR positive again will need to schedule colposcopy  Evaluation again. Order in. Please schedule with DL.

## 2015-06-21 NOTE — Telephone Encounter (Signed)
Patient returned call. She is given results of pap smear. Advised of need for colposcopy again, however, need to clarify with provider. Advised would return call with follow up. Patient agreeable.

## 2015-06-22 ENCOUNTER — Ambulatory Visit: Payer: No Typology Code available for payment source | Admitting: Certified Nurse Midwife

## 2015-06-22 NOTE — Telephone Encounter (Signed)
Message left to return call to Mateja Dier at [phone].    

## 2015-06-22 NOTE — Telephone Encounter (Signed)
-----   Message from Deborah S Leonard, CNM sent at 06/22/2015  7:51 AM EDT ----- Addendum to results  Pap smear ASCUS with negative HPVHR, but due to follow up from previous colpo and abnormal with ASCUS per guidelines needs colposcopy again 

## 2015-06-22 NOTE — Telephone Encounter (Signed)
-----   Message from Verner Chol, CNM sent at 06/22/2015  7:51 AM EDT ----- Addendum to results  Pap smear ASCUS with negative HPVHR, but due to follow up from previous colpo and abnormal with ASCUS per guidelines needs colposcopy again

## 2015-06-22 NOTE — Telephone Encounter (Signed)
Spoke with patient and message from Verner Chol CNM given.  She is agreeable to scheduling colposcopy.  Brief description of procedure given to patient.  Colposcopy pre-procedure instructions given. Discussed menses and need to not have any bleeding on day of appointment, advised to call to reschedule if starts cycle. Patient with IUD, on cycle now.  Make sure to eat a meal and hydrate before appointment.  Advised 800 mg of Motrin PO with food one hour prior to appointment.    Patient verbalized understanding of preprocedure instructions and will call to reschedule if will be on menses or has any concerns regarding pregnancy.  Patient is advised she will be contacted with insurance coverage information. cc Lilyan Gilford, Soledad Gerlach  Debbi,  Patient requests morning appointment due to her work schedule. She is scheduled for 07/07/15 at 1100. Procedure room is scheduled. Okay as scheduled?

## 2015-06-23 ENCOUNTER — Other Ambulatory Visit: Payer: No Typology Code available for payment source

## 2015-06-25 ENCOUNTER — Telehealth: Payer: Self-pay

## 2015-06-25 ENCOUNTER — Ambulatory Visit
Admission: RE | Admit: 2015-06-25 | Discharge: 2015-06-25 | Disposition: A | Discharge status: Home / Self Care | Payer: No Typology Code available for payment source | Source: Ambulatory Visit | Attending: Nurse Practitioner | Admitting: Nurse Practitioner

## 2015-06-25 DIAGNOSIS — N644 Mastodynia: Secondary | ICD-10-CM

## 2015-06-25 NOTE — Telephone Encounter (Signed)
Spoke with patient. Phone call passed from billing. Patient would like to reschedule her colposcopy. Patient has an IUD for contraception. Patient is requesting morning appointment only. Appointment rescheduled to 07/20/2015 at 11 am with Verner Chol CNM. Procedure room booked for appointment.  Instructions given. Motrin 800 mg po x , one hour before appointment with food. Make sure to eat a meal before appointment and drink plenty of fluids. Patient verbalized understanding and will call to reschedule if will be on menses or has any concerns regarding pregnancy. Advised will need to cancel within 24 hours or will have $150.00 late cancellation fee placed to account. Patient agreeable and verbalized understanding of all instructions.   Routing to provider for final review. Patient agreeable to disposition. Will close encounter.

## 2015-06-30 ENCOUNTER — Telehealth: Payer: Self-pay

## 2015-06-30 NOTE — Telephone Encounter (Signed)
lmtcb

## 2015-06-30 NOTE — Telephone Encounter (Signed)
Patient notified of results. See lab 

## 2015-06-30 NOTE — Telephone Encounter (Signed)
-----   Message from Verner Chol, CNM sent at 06/29/2015  6:26 PM EDT ----- Notify patient that her mammogram is negative with no suspicious masses and no tenderness by patient at time of exam. Has this resolved ? If so no office follow up needed. If not would like to recheck breast, pleas schedule.

## 2015-07-07 ENCOUNTER — Ambulatory Visit: Payer: No Typology Code available for payment source | Admitting: Certified Nurse Midwife

## 2015-07-20 ENCOUNTER — Ambulatory Visit (INDEPENDENT_AMBULATORY_CARE_PROVIDER_SITE_OTHER): Payer: No Typology Code available for payment source | Admitting: Certified Nurse Midwife

## 2015-07-20 ENCOUNTER — Ambulatory Visit: Payer: No Typology Code available for payment source | Admitting: Certified Nurse Midwife

## 2015-07-20 ENCOUNTER — Encounter: Payer: Self-pay | Admitting: Certified Nurse Midwife

## 2015-07-20 VITALS — BP 118/80 | HR 84 | Resp 16 | Wt 135.0 lb

## 2015-07-20 DIAGNOSIS — N760 Acute vaginitis: Secondary | ICD-10-CM

## 2015-07-20 DIAGNOSIS — R87810 Cervical high risk human papillomavirus (HPV) DNA test positive: Secondary | ICD-10-CM

## 2015-07-20 DIAGNOSIS — B9689 Other specified bacterial agents as the cause of diseases classified elsewhere: Secondary | ICD-10-CM

## 2015-07-20 DIAGNOSIS — R87610 Atypical squamous cells of undetermined significance on cytologic smear of cervix (ASC-US): Secondary | ICD-10-CM | POA: Diagnosis not present

## 2015-07-20 DIAGNOSIS — Z1239 Encounter for other screening for malignant neoplasm of breast: Secondary | ICD-10-CM | POA: Diagnosis not present

## 2015-07-20 DIAGNOSIS — Z01812 Encounter for preprocedural laboratory examination: Secondary | ICD-10-CM | POA: Diagnosis not present

## 2015-07-20 DIAGNOSIS — A499 Bacterial infection, unspecified: Secondary | ICD-10-CM

## 2015-07-20 LAB — POCT URINE PREGNANCY: PREG TEST UR: NEGATIVE

## 2015-07-20 NOTE — Progress Notes (Addendum)
Patient ID: Natalie Weber, female   DOB: [birth year], 44 y.o.   MRN: [id number]  Chief Complaint  Patient presents with  . Colposcopy    eh    HPI Natalie Weber is a 44 y.o. G3 p3003 divorced white female.  Here for colposcopy. Denies vaginal bleeding or vaginal pain. Contraception Liletta IUD. Patient also here for follow up breast exam for breast tenderness on left breast. Patient had diagnostic 3 D mammogram and no suspicious findings noted bilaterally.. Class C density. Patient feels this has resolved, just happens very occasionally. Feels it has been related to IUD insertion in the past year. Denies nipple discharge, masses or skin change or trauma to area. No other concerns HPI  Indications: Pap smear on June 16, 2015 showed: ASCUS with NEGATIVE high risk HPV. 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 Prior cervical treatment: no treatment. 03/27/14 Past Medical History  Diagnosis Date  . History of abnormal cervical Pap smear     previous colpo, 01-28-14 neg pap +HPV HR    Past Surgical History  Procedure Laterality Date  . Tonsillectomy    . Wisdom tooth extraction    . Colposcopy    . Intrauterine device (iud) insertion      liletta inserted 2016    Family History  Problem Relation Age of Onset  . Hypertension Mother   . Osteoporosis Mother   . Hypertension Father   . Osteoporosis Sister     Social History Social History  Substance Use Topics  . Smoking status: Never Smoker   . Smokeless tobacco: Never Used  . Alcohol Use: 1.8 oz/week    3 Standard drinks or equivalent per week    No Known Allergies  Current Outpatient Prescriptions  Medication Sig Dispense Refill  . levonorgestrel (LILETTA, 52 MG,) 18.6 MCG/DAY IUD IUD 1 each by Intrauterine route once.     No current facility-administered medications for this visit.    Review of Systems Review of Systems  Constitutional: Negative.   Genitourinary: Negative for vaginal  bleeding, vaginal discharge and vaginal pain.    Blood pressure 118/80, pulse 84, resp. rate 16, weight 135 lb (61.236 kg), last menstrual period 07/02/2015.  Physical Exam Physical Exam  Constitutional: She is oriented to person, place, and time. She appears well-developed and well-nourished.  Genitourinary: Vagina normal. No vaginal discharge found.    Neurological: She is alert and oriented to person, place, and time.  Skin: Skin is warm and dry.  Psychiatric: She has a normal mood and affect. Her behavior is normal. Judgment and thought content normal.  Breast exam bilateral normal, no masses, tenderness or nipple discharge or skin change. No axillary lymph node enlargement or tenderness.  Data Reviewed Reviewed pap smear results with patient, questions addressed.  Assessment Abnormal pap smear with ASCUS negative HPVHR, history of LSIL with previous colposcopy, here for colposcopy Left breast tenderness follow up    Procedure Details  The risks and benefits of the procedure and Written informed consent obtained.  Speculum placed in vagina and excellent visualization of cervix achieved. Affirm collected due to ? BV on pap smear.IUD string noted in cervix., Cervix swabbed x 3 with saline and  acetic acid solution. Cervix visualized with 3.75,7.5,15 # and green filter. Lugol's applied with all area taking stain. ECC obtained. Patient tolerated procedure well. No active bleeding noted on speculum removal. Instructions given.  Specimens: 1  Complications: none.     Plan    Specimens labelled and  sent to Pathology. Patient will be notified when pathology reviewed.  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 Patient to be called and repeat pap smear in one year will be discussed. Pap recall one year 08    Discussed normal breast exam findings and negative mammogram findings. Discussed hormonal breast tenderness  which can occur with menses and with IUD use. Patient will continue SBE and advise if changes.   LEONARD,DEBORAH 07/20/2015, 11:39 AM

## 2015-07-20 NOTE — Patient Instructions (Signed)

## 2015-07-20 NOTE — Progress Notes (Signed)
Patient ID: Natalie Weber, female   DOB: 1971/06/21, 44 y.o.   MRN: [id number] Patient took  of ibuprofen at 9:30am.

## 2015-07-21 ENCOUNTER — Other Ambulatory Visit: Payer: Self-pay | Admitting: Certified Nurse Midwife

## 2015-07-21 DIAGNOSIS — N760 Acute vaginitis: Principal | ICD-10-CM

## 2015-07-21 DIAGNOSIS — B3731 Acute candidiasis of vulva and vagina: Secondary | ICD-10-CM

## 2015-07-21 DIAGNOSIS — B373 Candidiasis of vulva and vagina: Secondary | ICD-10-CM

## 2015-07-21 DIAGNOSIS — B9689 Other specified bacterial agents as the cause of diseases classified elsewhere: Secondary | ICD-10-CM

## 2015-07-21 LAB — WET PREP BY MOLECULAR PROBE
CANDIDA SPECIES: POSITIVE — AB
GARDNERELLA VAGINALIS: POSITIVE — AB
TRICHOMONAS VAG: NEGATIVE

## 2015-07-21 MED ORDER — HYLAFEM VA SUPP: 1.0000 | Freq: Every day | VAGINAL | Status: DC

## 2015-07-22 LAB — IPS OTHER TISSUE BIOPSY

## 2015-07-22 NOTE — Progress Notes (Signed)
Encounter reviewed Jill Jertson, MD   

## 2015-07-27 ENCOUNTER — Telehealth: Payer: Self-pay | Admitting: Emergency Medicine

## 2015-07-27 NOTE — Telephone Encounter (Signed)
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 Repeat pap smear one year. 08

## 2015-07-27 NOTE — Telephone Encounter (Signed)
Spoke with patient and message from Leota Sauers CNM given.  Patient verbalizes understanding of results and importance of repeat pap smear again in one year.  08 Recall in place. Patient declines to schedule at this time.  Routing to provider for final review. Patient agreeable to disposition. Will close encounter.

## 2015-08-02 ENCOUNTER — Telehealth: Payer: Self-pay | Admitting: Emergency Medicine

## 2015-08-02 NOTE — Telephone Encounter (Signed)
-----   Message from Jerene Bears, MD sent at 08/01/2015 10:53 PM EDT ----- Regarding: RE: mammogram hold  Yes out of MMG hold.  Thanks.  MSM ----- Message -----    From: Joeseph Amor, RN    Sent: 07/28/2015   2:14 PM      To: Jerene Bears, MD Subject: mammogram hold                                 Dr. Hyacinth Meeker,  DL patient in Mammogram hold for follow up breast check. Completed imaging and breast check at colposcopy appointment 07/20/15. Okay to remove from mammogram hold?

## 2016-05-19 ENCOUNTER — Telehealth: Payer: Self-pay | Admitting: Certified Nurse Midwife

## 2016-05-19 NOTE — Telephone Encounter (Signed)
Left message regarding upcoming aex appointment 06/20/16 has been canceled and needs to be rescheduled with Deborah Leonard, CNM.  °

## 2016-06-20 ENCOUNTER — Ambulatory Visit: Payer: No Typology Code available for payment source | Admitting: Certified Nurse Midwife

## 2016-09-21 ENCOUNTER — Ambulatory Visit: Payer: No Typology Code available for payment source | Admitting: Certified Nurse Midwife

## 2016-09-26 NOTE — Progress Notes (Signed)
45 y.o. 43P3003 Married  Caucasian Fe here for annual exam. Periods none.  Happy with IUD. Married now with blended family of 5. Having some finger joint stiffness in am in right hand. No overuse that she is aware of. Plans to see orthopedic if continues. Sees Urgent care if needed. No health issues today. Fasted for screening labs.  No LMP recorded. Patient is not currently having periods (Reason: IUD).          Sexually active: Yes.    The current method of family planning is IUD.    Exercising: Yes.    walking Smoker:  no  Health Maintenance: Pap:  06-16-15 ASCUS HPV HR -, colpo 07-20-15 MMG:  06-25-15 category c density birads 1:neg Colonoscopy:  none BMD:   none TDaP:  Yrs ago, declines today Shingles: no Pneumonia: no Hep C and HIV: not done Labs: poct urine-neg, hgb-13.1 Self breast exam: done occ   reports that she has never smoked. She has never used smokeless tobacco. She reports that she drinks about 1.2 oz of alcohol per week . She reports that she does not use drugs.  Past Medical History:  Diagnosis Date  . History of abnormal cervical Pap smear    previous colpo, 01-28-14 neg pap +HPV HR    Past Surgical History:  Procedure Laterality Date  . COLPOSCOPY    . INTRAUTERINE DEVICE (IUD) INSERTION     liletta inserted 2016  . TONSILLECTOMY    . WISDOM TOOTH EXTRACTION      Current Outpatient Prescriptions  Medication Sig Dispense Refill  . levonorgestrel (LILETTA, 52 MG,) 18.6 MCG/DAY IUD IUD 1 each by Intrauterine route once.     No current facility-administered medications for this visit.     Family History  Problem Relation Age of Onset  . Hypertension Mother   . Osteoporosis Mother   . Hypertension Father   . Osteoporosis Sister     ROS:  Pertinent items are noted in HPI.  Otherwise, a comprehensive ROS was negative.  Exam:   BP 110/72   Pulse 70   Resp 16   Ht 5' 5.75" (1.67 m)   Wt 140 lb (63.5 kg)   BMI 22.77 kg/m  Height: 5' 5.75" (167 cm) Ht  Readings from Last 3 Encounters:  09/27/16 5' 5.75" (1.67 m)  06/16/15 5' 6.25" (1.683 m)  03/27/14 5\' 6"  (1.676 m)    General appearance: alert, cooperative and appears stated age Head: Normocephalic, without obvious abnormality, atraumatic Neck: no adenopathy, supple, symmetrical, trachea midline and thyroid normal to inspection and palpation Lungs: clear to auscultation bilaterally Breasts: normal appearance, no masses or tenderness, No nipple retraction or dimpling, No nipple discharge or bleeding, No axillary or supraclavicular adenopathy Heart: regular rate and rhythm Abdomen: soft, non-tender; no masses,  no organomegaly Extremities: extremities normal, atraumatic, no cyanosis or edema Skin: Skin color, texture, turgor normal. No rashes or lesions Lymph nodes: Cervical, supraclavicular, and axillary nodes normal. No abnormal inguinal nodes palpated Neurologic: Grossly normal   Pelvic: External genitalia:  no lesions              Urethra:  normal appearing urethra with no masses, tenderness or lesions              Bartholin's and Skene's: normal                 Vagina: normal appearing vagina with normal color and discharge, no lesions  Cervix: multiparous appearance, no cervical motion tenderness and no lesions              Pap taken: Yes.   Bimanual Exam:  Uterus:  normal size, contour, position, consistency, mobility, non-tender              Adnexa: normal adnexa and no mass, fullness, tenderness               Rectovaginal: Confirms               Anus:  normal sphincter tone, no lesions  Chaperone present: yes  A:  Well Woman with normal exam  Contraception Mirena IUD due for removal 2021  Finger joint pain plans to see MD for evaluation if continues  Height loss of 1 1/2 in in past 3 years, family history of early Osteoporosis with mother, sister and MGM  Mammogram due  Screening labs  History of ASCUS pap with +HPVHR negative colpo, follow pap today  P:    Reviewed health and wellness pertinent to exam  Reviewed warning signs of IUD and need to advise  Discussed evaluation with BMD even though not menopausal. Patient agreeable and will call and schedule with mammogram. If problems scheduling will call office.  Pap smear as above with HPVHR if negative will repeat in one year, if not per results  Labs CMP, Vitamin D, TSH, Lipid panel   counseled on breast self exam, mammography screening, adequate intake of calcium and vitamin D, diet and exercise  return annually or prn  An After Visit Summary was printed and given to the patient.

## 2016-09-27 ENCOUNTER — Ambulatory Visit (INDEPENDENT_AMBULATORY_CARE_PROVIDER_SITE_OTHER): Payer: BLUE CROSS/BLUE SHIELD | Admitting: Certified Nurse Midwife

## 2016-09-27 ENCOUNTER — Encounter: Payer: Self-pay | Admitting: Certified Nurse Midwife

## 2016-09-27 VITALS — BP 110/72 | HR 70 | Resp 16 | Ht 65.75 in | Wt 140.0 lb

## 2016-09-27 DIAGNOSIS — Z0000 Encounter for general adult medical examination without abnormal findings: Secondary | ICD-10-CM | POA: Diagnosis not present

## 2016-09-27 DIAGNOSIS — R29890 Loss of height: Secondary | ICD-10-CM

## 2016-09-27 DIAGNOSIS — Z124 Encounter for screening for malignant neoplasm of cervix: Secondary | ICD-10-CM

## 2016-09-27 DIAGNOSIS — Z01419 Encounter for gynecological examination (general) (routine) without abnormal findings: Secondary | ICD-10-CM

## 2016-09-27 LAB — POCT URINALYSIS DIPSTICK
Bilirubin, UA: NEGATIVE
GLUCOSE UA: NEGATIVE
KETONES UA: NEGATIVE
Leukocytes, UA: NEGATIVE
Nitrite, UA: NEGATIVE
Protein, UA: NEGATIVE
RBC UA: NEGATIVE
UROBILINOGEN UA: NEGATIVE
pH, UA: 5

## 2016-09-27 LAB — LIPID PANEL
CHOL/HDL RATIO: 2.8 ratio (ref <5.0)
CHOLESTEROL: 205 mg/dL — AB (ref <200)
HDL: 73 mg/dL (ref >50)
LDL Cholesterol: 86 mg/dL (ref <100)
TRIGLYCERIDES: 230 mg/dL — AB (ref <150)
VLDL: 46 mg/dL — AB (ref <30)

## 2016-09-27 LAB — COMPREHENSIVE METABOLIC PANEL
ALBUMIN: 4.1 g/dL (ref 3.6–5.1)
ALT: 13 U/L (ref 6–29)
AST: 17 U/L (ref 10–35)
Alkaline Phosphatase: 56 U/L (ref 33–115)
BUN: 11 mg/dL (ref 7–25)
CHLORIDE: 103 mmol/L (ref 98–110)
CO2: 22 mmol/L (ref 20–31)
CREATININE: 0.76 mg/dL (ref 0.50–1.10)
Calcium: 9.5 mg/dL (ref 8.6–10.2)
Glucose, Bld: 79 mg/dL (ref 65–99)
POTASSIUM: 4 mmol/L (ref 3.5–5.3)
SODIUM: 139 mmol/L (ref 135–146)
Total Bilirubin: 0.4 mg/dL (ref 0.2–1.2)
Total Protein: 6.9 g/dL (ref 6.1–8.1)

## 2016-09-27 LAB — TSH: TSH: 1.52 mIU/L

## 2016-09-27 NOTE — Patient Instructions (Signed)

## 2016-09-28 LAB — VITAMIN D 25 HYDROXY (VIT D DEFICIENCY, FRACTURES): VIT D 25 HYDROXY: 26 ng/mL — AB (ref 30–100)

## 2016-09-28 NOTE — Progress Notes (Signed)
Encounter reviewed Jill Jertson, MD   

## 2016-09-29 LAB — HEMOGLOBIN, FINGERSTICK: Hemoglobin, fingerstick: 13.1 g/dL (ref 12.0–16.0)

## 2016-10-04 LAB — IPS PAP TEST WITH HPV

## 2016-10-06 ENCOUNTER — Other Ambulatory Visit: Payer: Self-pay | Admitting: *Deleted

## 2016-10-19 ENCOUNTER — Telehealth: Payer: Self-pay | Admitting: Certified Nurse Midwife

## 2016-10-19 ENCOUNTER — Other Ambulatory Visit: Payer: Self-pay | Admitting: Certified Nurse Midwife

## 2016-10-19 DIAGNOSIS — Z1231 Encounter for screening mammogram for malignant neoplasm of breast: Secondary | ICD-10-CM

## 2016-10-19 DIAGNOSIS — R29890 Loss of height: Secondary | ICD-10-CM

## 2016-10-19 DIAGNOSIS — Z8262 Family history of osteoporosis: Secondary | ICD-10-CM

## 2016-10-19 NOTE — Telephone Encounter (Signed)
Patient is requesting a referral to [facility] for a BMD. Patient called [facility]  to schedule the BMD and was told our office will need to send a referral before scheduling.

## 2016-10-19 NOTE — Telephone Encounter (Signed)
[facility] Patient has been notified and will contact the [facility] to schedule.  Routing to provider for final review. Patient agreeable to disposition. Will close encounter.

## 2016-10-24 ENCOUNTER — Encounter: Payer: Self-pay | Admitting: Certified Nurse Midwife

## 2016-10-24 ENCOUNTER — Ambulatory Visit (INDEPENDENT_AMBULATORY_CARE_PROVIDER_SITE_OTHER): Payer: BLUE CROSS/BLUE SHIELD | Admitting: Certified Nurse Midwife

## 2016-10-24 VITALS — BP 106/78 | HR 70 | Resp 14 | Ht 66.0 in | Wt 143.4 lb

## 2016-10-24 DIAGNOSIS — R8789 Other abnormal findings in specimens from female genital organs: Secondary | ICD-10-CM | POA: Diagnosis not present

## 2016-10-24 DIAGNOSIS — R87618 Other abnormal cytological findings on specimens from cervix uteri: Secondary | ICD-10-CM

## 2016-10-24 NOTE — Progress Notes (Signed)
Patient ID: Natalie Weber, female   DOB: 18-Jun-1971, 46 y.o.   MRN: [id number]  Chief Complaint  Patient presents with  . Procedure    colposcopy/ec    HPI Natalie LongsCatherine Ann Weber is a 46 y.o. white g3 p3003 divorced female.  Here for colposcopy exam. Denies pelvic pain or vaginal bleeding. HPI  Indications: Pap smear on 12/6 2017 showed: positive HPVHR. Previous colposcopy: atypia with HPV effect 06/25/15. Prior cervical treatment: expectant managment.  Past Medical History:  Diagnosis Date  . History of abnormal cervical Pap smear    previous colpo, 01-28-14 neg pap +HPV HR    Past Surgical History:  Procedure Laterality Date  . COLPOSCOPY    . INTRAUTERINE DEVICE (IUD) INSERTION     liletta inserted 2016  . TONSILLECTOMY    . WISDOM TOOTH EXTRACTION      Family History  Problem Relation Age of Onset  . Hypertension Mother   . Osteoporosis Mother   . Hypertension Father   . Osteoporosis Sister     Social History Social History  Substance Use Topics  . Smoking status: Never Smoker  . Smokeless tobacco: Never Used  . Alcohol use 1.2 oz/week    2 Standard drinks or equivalent per week    No Known Allergies  Current Outpatient Prescriptions  Medication Sig Dispense Refill  . levonorgestrel (LILETTA, 52 MG,) 18.6 MCG/DAY IUD IUD 1 each by Intrauterine route once.     No current facility-administered medications for this visit.     Review of Systems Review of Systems  Constitutional: Negative.   Gastrointestinal: Negative for abdominal pain.  Genitourinary: Negative for vaginal bleeding, vaginal discharge and vaginal pain.    Blood pressure 106/78, pulse 70, resp. rate 14, height 5\' 6"  (1.676 m), weight 143 lb 6.4 oz (65 kg).  Physical Exam Physical Exam  Constitutional: She is oriented to person, place, and time. She appears well-developed and well-nourished.  Genitourinary: Vagina normal. There is no rash, tenderness or lesion on the right labia.  There is no rash, tenderness or lesion on the left labia.    Neurological: She is alert and oriented to person, place, and time.  Skin: Skin is warm and dry.  Psychiatric: She has a normal mood and affect. Her behavior is normal. Judgment and thought content normal.    Data Reviewed Reviewed pap smear results. Questions answered.  Assessment    Procedure Details  The risks and benefits of the procedure and Written informed consent obtained.  Speculum placed in vagina and excellent visualization of cervix achieved, cervix swabbed x 3 with saline solution and  acetic acid solution. Cervix viewed with 3.75, 7.5, 15 # and green filter, small acetowhite area noted at 4 o'clock, Lugol's solution applied with non staining noted in same area. Biopsy taken at 4 o'clock. ECC taken. Monsel's applied IUD string noted in place. No bleeding noted upon removal of speculum. Patient  Tolerated procedure well. Instructions given.  Specimens: 2  Complications: none.     Plan    Specimens labelled and sent to Pathology. Patient will be notified of results once reviewed. Information on folic acid given per request. Pathology reviewed with Biopsy showing squamous mucosa with HPV cytopathic effect ECC benign glandular endocervical tissue, negative for dysplasia and malignancy Patient to be notified and need for repeat pap smear in one year. Pap recall 08      Emie Sommerfeld 10/24/2016, 2:25 PM

## 2016-10-24 NOTE — Patient Instructions (Signed)
Colposcopy, Care After This sheet gives you information about how to care for yourself after your procedure. Your doctor may also give you more specific instructions. If you have problems or questions, contact your doctor. What can I expect after the procedure? If you did not have a tissue sample removed (did not have a biopsy), you may only have some spotting for a few days. You can go back to your normal activities. If you had a tissue sample removed, it is common to have:  Soreness and pain. This may last for a few days.  Light-headedness.  Mild bleeding from your vagina or dark-colored, grainy discharge from your vagina. This may last for a few days. You may need to wear a sanitary pad.  Spotting for at least 48 hours after the procedure. Follow these instructions at home:  Take over-the-counter and prescription medicines only as told by your doctor. Ask your doctor what medicines you can start taking again. This is very important if you take blood-thinning medicine.  Do not drive or use heavy machinery while taking prescription pain medicine.  For 3 days, or as long as your doctor tells you, avoid:  Douching.  Using tampons.  Having sex.  If you use birth control (contraception), keep using it.  Limit activity for the first day after the procedure. Ask your doctor what activities are safe for you.  It is up to you to get the results of your procedure. Ask your doctor when your results will be ready.  Keep all follow-up visits as told by your doctor. This is important. Contact a doctor if:  You get a skin rash. Get help right away if:  You are bleeding a lot from your vagina. It is a lot of bleeding if you are using more than one pad an hour for 2 hours in a row.  You have clumps of blood (blood clots) coming from your vagina.  You have a fever.  You have chills  You have pain in your lower belly (pelvic area).  You have signs of infection, such as vaginal  discharge that is:  Different than usual.  Yellow.  Bad-smelling.  You have very pain or cramps in your lower belly that do not get better with medicine.  You feel light-headed.  You feel dizzy.  You pass out (faint). Summary  If you did not have a tissue sample removed (did not have a biopsy), you may only have some spotting for a few days. You can go back to your normal activities.  If you had a tissue sample removed, it is common to have mild pain and spotting for 48 hours.  For 3 days, or as long as your doctor tells you, avoid douching, using tampons and having sex.  Get help right away if you have bleeding, very bad pain, or signs of infection. This information is not intended to replace advice given to you by your health care provider. Make sure you discuss any questions you have with your health care provider. Document Released: 03/27/2008 Document Revised: 06/28/2016 Document Reviewed: 06/28/2016 Elsevier Interactive Patient Education  2017 [employer]  

## 2016-10-25 ENCOUNTER — Telehealth: Payer: Self-pay | Admitting: Certified Nurse Midwife

## 2016-10-25 NOTE — Telephone Encounter (Signed)
agree

## 2016-10-25 NOTE — Telephone Encounter (Signed)
Patient called and said, "I had a colposcopy yesterday and I think I am having an allergic reaction to the solution that was used.  My skin is raw, red, and sand-papery."  Last seen: 10/25/15

## 2016-10-25 NOTE — Telephone Encounter (Signed)
Spoke with patient. Patient had a colposcopy yesterday with Leota Sauerseborah Leonard CNM. States she has had two colposcopies before without problems. Last night she states her entire vaginal area felt like "sand paper." Patient washed vaginal area well and applied Hydrocortisone cream externally. Hydrocortisone cream cause burning. Patient has not applied anything else to the area. Woke up this morning and tissue is red and slightly swollen. "I can feel it when I am walking because it is irritated." Patient is asking for recommendations from Leota Sauerseborah Leonard CNM of something she can apply to the area for relief. Advised I will review with Leota Sauerseborah Leonard CNM and return call. Patient is agreeable.

## 2016-10-25 NOTE — Telephone Encounter (Signed)
Spoke with patient. Advised RN has reviewed symptoms with Natalie Weber CNM who recommends that she mix baking soda or Aveeno soothing bath with luke warm water in a cup to pour over vaginal area. Will need to open labia to ensure mixture is covering the entire vaginal area for relief. May do this multiple times per day. If symptoms worsen or develops new symptoms she will need to be seen for further evaluation. Patient is agreeable. May also take 600-800 mg of Ibuprofen every 6-8 hours for discomfort.   Natalie Weber CNM do you agree with recommendations?

## 2016-10-26 ENCOUNTER — Telehealth: Payer: Self-pay | Admitting: *Deleted

## 2016-10-26 LAB — IPS OTHER TISSUE BIOPSY

## 2016-10-26 NOTE — Telephone Encounter (Signed)
Left message to call Abie Cheek at [phone].  

## 2016-10-26 NOTE — Telephone Encounter (Signed)
-----   Message from Verner Choleborah S Leonard, CNM sent at 10/26/2016 12:15 PM EST ----- Notify patient that Biopsy showed squamous mucosa with HPV cytopathic effect ECC benign glandular endocervical tissue, negative for dysplasia or malignancy Needs repeat pap in one year pap recall 08

## 2016-10-27 NOTE — Progress Notes (Signed)
Encounter reviewed Jill Jertson, MD   

## 2016-10-31 NOTE — Telephone Encounter (Signed)
Spoke with patient, advised of results and recommendations as seen below per Deborah Leonard, CNM. Patient verbalizes understanding and is agreeable.  Routing to provider for final review. Patient is agreeable to disposition. Will close encounter.  

## 2016-11-15 ENCOUNTER — Ambulatory Visit
Admission: RE | Admit: 2016-11-15 | Discharge: 2016-11-15 | Disposition: A | Discharge status: Home / Self Care | Payer: BLUE CROSS/BLUE SHIELD | Source: Ambulatory Visit | Attending: Certified Nurse Midwife | Admitting: Certified Nurse Midwife

## 2016-11-15 DIAGNOSIS — Z1231 Encounter for screening mammogram for malignant neoplasm of breast: Secondary | ICD-10-CM

## 2016-11-15 DIAGNOSIS — R29890 Loss of height: Secondary | ICD-10-CM

## 2016-11-15 DIAGNOSIS — Z8262 Family history of osteoporosis: Secondary | ICD-10-CM

## 2017-09-28 ENCOUNTER — Ambulatory Visit: Payer: BLUE CROSS/BLUE SHIELD | Admitting: Certified Nurse Midwife

## 2017-12-05 ENCOUNTER — Ambulatory Visit: Payer: BLUE CROSS/BLUE SHIELD | Admitting: Certified Nurse Midwife

## 2017-12-05 ENCOUNTER — Other Ambulatory Visit: Payer: Self-pay

## 2017-12-05 ENCOUNTER — Encounter: Payer: Self-pay | Admitting: Certified Nurse Midwife

## 2017-12-05 VITALS — BP 110/80 | HR 88 | Resp 14 | Ht 65.75 in | Wt 141.0 lb

## 2017-12-05 DIAGNOSIS — R899 Unspecified abnormal finding in specimens from other organs, systems and tissues: Secondary | ICD-10-CM | POA: Diagnosis not present

## 2017-12-05 DIAGNOSIS — Z0000 Encounter for general adult medical examination without abnormal findings: Secondary | ICD-10-CM | POA: Diagnosis not present

## 2017-12-05 DIAGNOSIS — E559 Vitamin D deficiency, unspecified: Secondary | ICD-10-CM | POA: Diagnosis not present

## 2017-12-05 DIAGNOSIS — Z01419 Encounter for gynecological examination (general) (routine) without abnormal findings: Secondary | ICD-10-CM | POA: Diagnosis not present

## 2017-12-05 DIAGNOSIS — Z124 Encounter for screening for malignant neoplasm of cervix: Secondary | ICD-10-CM

## 2017-12-05 NOTE — Patient Instructions (Signed)

## 2017-12-05 NOTE — Progress Notes (Addendum)
47 y.o. G42P3003 Married  Caucasian Fe here for annual exam.  Periods none with IUD, working well without problems Worried about height loss, working on some exercise and good diet with calcium. Busy with work and family. Sees Urgent care if needed. No health issues today. Would like to plan screening fasting labs and bring daughter 55 in for appointment also.  No LMP recorded. Patient is not currently having periods (Reason: IUD).          Sexually active: Yes.    The current method of family planning is IUD. Removal due 2021.   Exercising: Yes.    walking Smoker:  no  Health Maintenance: Pap:  06-16-15 ASCUS HPV HR-, 09-27-16 neg HPV HR neg History of Abnormal Pap: yes MMG:  11-15-16 category c density birads 1:neg will schedule Self Breast exams: yes Colonoscopy:  none BMD:   2018 normal TDaP:  Yrs ago, declined Shingles: no Pneumonia: no Hep C and HIV: no Labs: discuss with provider    reports that  has never smoked. she has never used smokeless tobacco. She reports that she drinks about 1.2 oz of alcohol per week. She reports that she does not use drugs.  Past Medical History:  Diagnosis Date  . History of abnormal cervical Pap smear    4/15 & 12/17 neg HPV HR+    Past Surgical History:  Procedure Laterality Date  . COLPOSCOPY     6/15 LGSIL & 1/18 squamous mucosa  . INTRAUTERINE DEVICE (IUD) INSERTION     liletta inserted 2016  . TONSILLECTOMY    . WISDOM TOOTH EXTRACTION      Current Outpatient Medications  Medication Sig Dispense Refill  . levonorgestrel (LILETTA, 52 MG,) 18.6 MCG/DAY IUD IUD 1 each by Intrauterine route once.     No current facility-administered medications for this visit.     Family History  Problem Relation Age of Onset  . Hypertension Mother   . Osteoporosis Mother   . Hypertension Father   . Osteoporosis Sister     ROS:  Pertinent items are noted in HPI.  Otherwise, a comprehensive ROS was negative.  Exam:   BP 110/80 (BP Location:  Right Arm, Patient Position: Sitting, Cuff Size: Normal)   Pulse 88   Resp 14   Ht 5' 5.75" (1.67 m)   Wt 141 lb (64 kg)   BMI 22.93 kg/m  Height: 5' 5.75" (167 cm) Ht Readings from Last 3 Encounters:  12/05/17 5' 5.75" (1.67 m)  10/24/16 5\' 6"  (1.676 m)  09/27/16 5' 5.75" (1.67 m)    General appearance: alert, cooperative and appears stated age Head: Normocephalic, without obvious abnormality, atraumatic Neck: no adenopathy, supple, symmetrical, trachea midline and thyroid normal to inspection and palpation Lungs: clear to auscultation bilaterally Breasts: normal appearance, no masses or tenderness, No nipple retraction or dimpling, No nipple discharge or bleeding, No axillary or supraclavicular adenopathy Heart: regular rate and rhythm Abdomen: soft, non-tender; no masses,  no organomegaly Extremities: extremities normal, atraumatic, no cyanosis or edema Skin: Skin color, texture, turgor normal. No rashes or lesions Lymph nodes: Cervical, supraclavicular, and axillary nodes normal. No abnormal inguinal nodes palpated Neurologic: Grossly normal   Pelvic: External genitalia:  no lesions              Urethra:  normal appearing urethra with no masses, tenderness or lesions              Bartholin's and Skene's: normal  Vagina: normal appearing vagina with normal color and discharge, no lesions              Cervix: multiparous appearance, no bleeding following Pap, no cervical motion tenderness, no lesions and IUD string noted in cervix              Pap taken: Yes.   Bimanual Exam:  Uterus:  normal size, contour, position, consistency, mobility, non-tender and anteverted              Adnexa: normal adnexa and no mass, fullness, tenderness               Rectovaginal: Confirms               Anus:  normal sphincter tone, no lesions  Chaperone present: yes  A:  Well Woman with normal exam  Contraception IUD Liletta due for removal 2021  History of + HPVHR in 2017,  ASCUS pap in 2016, LSIL in 6/15, follow pap today  Screening labs will schedule fasting  P:   Reviewed health and wellness pertinent to exam  Warning signs of IUD discussed and need to advise  Labs: future:Lipid panel, CMP, Vitamin D, patient will schedule  Pap smear: yes   counseled on breast self exam, mammography screening, adequate intake of calcium and vitamin D, diet and exercise  return annually or prn  An After Visit Summary was printed and given to the patient.

## 2017-12-06 ENCOUNTER — Other Ambulatory Visit (HOSPITAL_COMMUNITY)
Admission: RE | Admit: 2017-12-06 | Discharge: 2017-12-06 | Disposition: A | Discharge status: Home / Self Care | Payer: BLUE CROSS/BLUE SHIELD | Source: Ambulatory Visit | Attending: Certified Nurse Midwife | Admitting: Certified Nurse Midwife

## 2017-12-06 ENCOUNTER — Other Ambulatory Visit: Payer: Self-pay | Admitting: Certified Nurse Midwife

## 2017-12-06 ENCOUNTER — Ambulatory Visit: Payer: BLUE CROSS/BLUE SHIELD | Admitting: Certified Nurse Midwife

## 2017-12-06 DIAGNOSIS — Z124 Encounter for screening for malignant neoplasm of cervix: Secondary | ICD-10-CM | POA: Insufficient documentation

## 2017-12-06 DIAGNOSIS — Z1231 Encounter for screening mammogram for malignant neoplasm of breast: Secondary | ICD-10-CM

## 2017-12-06 NOTE — Addendum Note (Signed)
Addended by: Verner CholLEONARD, Noreene Boreman S on: 12/06/2017 10:46 AM   Modules accepted: Orders

## 2017-12-09 LAB — CYTOLOGY - PAP
DIAGNOSIS: NEGATIVE
HPV (WINDOPATH): NOT DETECTED

## 2017-12-10 ENCOUNTER — Ambulatory Visit
Admission: RE | Admit: 2017-12-10 | Discharge: 2017-12-10 | Disposition: A | Discharge status: Home / Self Care | Payer: BLUE CROSS/BLUE SHIELD | Source: Ambulatory Visit | Attending: Certified Nurse Midwife | Admitting: Certified Nurse Midwife

## 2017-12-10 DIAGNOSIS — Z1231 Encounter for screening mammogram for malignant neoplasm of breast: Secondary | ICD-10-CM

## 2018-01-23 ENCOUNTER — Other Ambulatory Visit: Payer: BLUE CROSS/BLUE SHIELD

## 2018-01-23 ENCOUNTER — Other Ambulatory Visit: Payer: Self-pay | Admitting: Certified Nurse Midwife

## 2018-01-23 DIAGNOSIS — E559 Vitamin D deficiency, unspecified: Secondary | ICD-10-CM

## 2018-01-23 DIAGNOSIS — R6889 Other general symptoms and signs: Secondary | ICD-10-CM

## 2018-02-28 ENCOUNTER — Other Ambulatory Visit: Payer: BLUE CROSS/BLUE SHIELD

## 2018-02-28 ENCOUNTER — Telehealth: Payer: Self-pay | Admitting: Certified Nurse Midwife

## 2018-02-28 NOTE — Telephone Encounter (Signed)
Patient will call back to reschedule a fasting lab appointment. Will close encounter

## 2018-02-28 NOTE — Telephone Encounter (Signed)
Left message regarding missed lab appointment.  °

## 2018-06-11 ENCOUNTER — Telehealth: Payer: Self-pay

## 2018-06-11 NOTE — Telephone Encounter (Signed)
-----   Message from Verner Choleborah S Leonard, CNM sent at 06/11/2018  8:02 AM EDT ----- Regarding: has not had fasting labs done as discussed Please call

## 2018-06-11 NOTE — Telephone Encounter (Signed)
Left message for patient to callback & schedule fasting lab appt.

## 2018-06-13 NOTE — Telephone Encounter (Signed)
Left message for call back.

## 2018-06-14 NOTE — Telephone Encounter (Signed)
Ok to close

## 2018-06-14 NOTE — Telephone Encounter (Signed)
No callback from patient. Okay to close encounter? Please advise.

## 2018-12-10 ENCOUNTER — Ambulatory Visit (INDEPENDENT_AMBULATORY_CARE_PROVIDER_SITE_OTHER): Payer: BLUE CROSS/BLUE SHIELD | Admitting: Certified Nurse Midwife

## 2018-12-10 ENCOUNTER — Other Ambulatory Visit: Payer: Self-pay

## 2018-12-10 ENCOUNTER — Encounter: Payer: Self-pay | Admitting: Certified Nurse Midwife

## 2018-12-10 VITALS — BP 110/68 | HR 68 | Resp 16 | Ht 66.0 in | Wt 136.0 lb

## 2018-12-10 DIAGNOSIS — E559 Vitamin D deficiency, unspecified: Secondary | ICD-10-CM

## 2018-12-10 DIAGNOSIS — Z0000 Encounter for general adult medical examination without abnormal findings: Secondary | ICD-10-CM

## 2018-12-10 DIAGNOSIS — Z30431 Encounter for routine checking of intrauterine contraceptive device: Secondary | ICD-10-CM | POA: Diagnosis not present

## 2018-12-10 DIAGNOSIS — Z01419 Encounter for gynecological examination (general) (routine) without abnormal findings: Secondary | ICD-10-CM | POA: Diagnosis not present

## 2018-12-10 DIAGNOSIS — Z1211 Encounter for screening for malignant neoplasm of colon: Secondary | ICD-10-CM

## 2018-12-10 DIAGNOSIS — R6889 Other general symptoms and signs: Secondary | ICD-10-CM

## 2018-12-10 NOTE — Progress Notes (Signed)
48 y.o. G50P3003 Married  Caucasian Fe here for annual exam. Periods none. IUD working well. Sees Urgent care if needed, no visits in last year. Has lost some weight in the past year. Eating healthy,but busy with work. Would like screening labs today. No other health issues today.  No LMP recorded. (Menstrual status: IUD).          Sexually active: Yes.    The current method of family planning is IUD,liletta inserted 2016    Exercising: Yes.    walking Smoker:  no  Review of Systems  Constitutional: Negative.   HENT: Negative.   Eyes: Negative.   Respiratory: Negative.   Cardiovascular: Negative.   Gastrointestinal: Negative.   Genitourinary: Negative.   Musculoskeletal: Negative.   Skin: Negative.   Neurological: Negative.   Endo/Heme/Allergies: Negative.   Psychiatric/Behavioral: Negative.     Health Maintenance: Pap:  06-16-15 ASCUS HPV HR-, 09-27-16 neg HPV HR+, 12-06-17 neg HPV HR neg History of Abnormal Pap: yes MMG:  12-10-17 category c density birads 1:neg Self Breast exams: no Colonoscopy:  none BMD:   2018 normal TDaP:  Yrs ago, declined Shingles: no Pneumonia: no Hep C and HIV: not done Labs: yes   reports that she has never smoked. She has never used smokeless tobacco. She reports current alcohol use of about 2.0 standard drinks of alcohol per week. She reports that she does not use drugs.  Past Medical History:  Diagnosis Date  . History of abnormal cervical Pap smear    4/15 & 12/17 neg HPV HR+    Past Surgical History:  Procedure Laterality Date  . COLPOSCOPY     6/15 LGSIL & 1/18 squamous mucosa  . INTRAUTERINE DEVICE (IUD) INSERTION     liletta inserted 2016  . TONSILLECTOMY    . WISDOM TOOTH EXTRACTION      Current Outpatient Medications  Medication Sig Dispense Refill  . levonorgestrel (LILETTA, 52 MG,) 18.6 MCG/DAY IUD IUD 1 each by Intrauterine route once.     No current facility-administered medications for this visit.     Family History   Problem Relation Age of Onset  . Hypertension Mother   . Osteoporosis Mother   . Hypertension Father   . Osteoporosis Sister     ROS:  Pertinent items are noted in HPI.  Otherwise, a comprehensive ROS was negative.  Exam:   There were no vitals taken for this visit.   Ht Readings from Last 3 Encounters:  12/05/17 5' 5.75" (1.67 m)  10/24/16 5\' 6"  (1.676 m)  09/27/16 5' 5.75" (1.67 m)    General appearance: alert, cooperative and appears stated age Head: Normocephalic, without obvious abnormality, atraumatic Neck: no adenopathy, supple, symmetrical, trachea midline and thyroid normal to inspection and palpation Lungs: clear to auscultation bilaterally Breasts: normal appearance, no masses or tenderness, No nipple retraction or dimpling, No nipple discharge or bleeding, No axillary or supraclavicular adenopathy Heart: regular rate and rhythm Abdomen: soft, non-tender; no masses,  no organomegaly Extremities: extremities normal, atraumatic, no cyanosis or edema Skin: Skin color, texture, turgor normal. No rashes or lesions Lymph nodes: Cervical, supraclavicular, and axillary nodes normal. No abnormal inguinal nodes palpated Neurologic: Grossly normal   Pelvic: External genitalia:  no lesions, normal female              Urethra:  normal appearing urethra with no masses, tenderness or lesions              Bartholin's and Skene's: normal  Vagina: normal appearing vagina with normal color and discharge, no lesions              Cervix: no bleeding following Pap, no cervical motion tenderness and no lesions              Pap taken: No. Bimanual Exam:  Uterus:  normal size, contour, position, consistency, mobility, non-tender and anteverted              Adnexa: normal adnexa and no mass, fullness, tenderness               Rectovaginal: Confirms               Anus:  normal sphincter tone, no lesions  Chaperone present: yes  A:  Well Woman with normal  exam  Contraception Liletta  IUD due for removal 02/2020  Colonoscopy due  Mammogram due  Screening labs  Immunization update, declines TDAP  P:   Reviewed health and wellness pertinent to exam  Reviewed warning signs with IUD and need to advise if occurs  Discussed risks/benefits of colonoscopy, desires referral. Patient will be called with information regarding appointment.  Stressed SBE and mammogram screening. Patient to schedule.  Labs; Vitamin D, TSH, Lipid panel, CMP, CBC Discussed importance of diet and exercise, Calcium and Vitamin D, SBE, Mammogram.  Rv annual, prn  An After Visit Summary was printed and given to the patient.

## 2018-12-11 ENCOUNTER — Other Ambulatory Visit: Payer: Self-pay | Admitting: Certified Nurse Midwife

## 2018-12-11 ENCOUNTER — Other Ambulatory Visit: Payer: Self-pay

## 2018-12-11 ENCOUNTER — Telehealth: Payer: Self-pay

## 2018-12-11 DIAGNOSIS — E559 Vitamin D deficiency, unspecified: Secondary | ICD-10-CM

## 2018-12-11 DIAGNOSIS — R899 Unspecified abnormal finding in specimens from other organs, systems and tissues: Secondary | ICD-10-CM

## 2018-12-11 LAB — CBC
HEMOGLOBIN: 13.9 g/dL (ref 11.1–15.9)
Hematocrit: 42.2 % (ref 34.0–46.6)
MCH: 32.6 pg (ref 26.6–33.0)
MCHC: 32.9 g/dL (ref 31.5–35.7)
MCV: 99 fL — ABNORMAL HIGH (ref 79–97)
Platelets: 258 10*3/uL (ref 150–450)
RBC: 4.26 x10E6/uL (ref 3.77–5.28)
RDW: 12.6 % (ref 11.7–15.4)
WBC: 5.4 10*3/uL (ref 3.4–10.8)

## 2018-12-11 LAB — LIPID PANEL
CHOLESTEROL TOTAL: 209 mg/dL — AB (ref 100–199)
Chol/HDL Ratio: 2.5 ratio (ref 0.0–4.4)
HDL: 84 mg/dL (ref >39)
LDL Calculated: 81 mg/dL (ref 0–99)
TRIGLYCERIDES: 222 mg/dL — AB (ref 0–149)
VLDL Cholesterol Cal: 44 mg/dL — ABNORMAL HIGH (ref 5–40)

## 2018-12-11 LAB — COMPREHENSIVE METABOLIC PANEL
ALT: 35 IU/L — ABNORMAL HIGH (ref 0–32)
AST: 44 IU/L — ABNORMAL HIGH (ref 0–40)
Albumin/Globulin Ratio: 1.7 (ref 1.2–2.2)
Albumin: 4.5 g/dL (ref 3.8–4.8)
Alkaline Phosphatase: 76 IU/L (ref 39–117)
BUN/Creatinine Ratio: 9 (ref 9–23)
BUN: 6 mg/dL (ref 6–24)
Bilirubin Total: <0.2 mg/dL (ref 0.0–1.2)
CALCIUM: 9.6 mg/dL (ref 8.7–10.2)
CO2: 22 mmol/L (ref 20–29)
Chloride: 99 mmol/L (ref 96–106)
Creatinine, Ser: 0.64 mg/dL (ref 0.57–1.00)
GFR calc Af Amer: 122 mL/min/{1.73_m2} (ref >59)
GFR, EST NON AFRICAN AMERICAN: 106 mL/min/{1.73_m2} (ref >59)
Globulin, Total: 2.7 g/dL (ref 1.5–4.5)
Glucose: 68 mg/dL (ref 65–99)
Potassium: 4.2 mmol/L (ref 3.5–5.2)
Sodium: 139 mmol/L (ref 134–144)
Total Protein: 7.2 g/dL (ref 6.0–8.5)

## 2018-12-11 LAB — TSH: TSH: 1.94 u[IU]/mL (ref 0.450–4.500)

## 2018-12-11 LAB — VITAMIN D 25 HYDROXY (VIT D DEFICIENCY, FRACTURES): Vit D, 25-Hydroxy: 19.7 ng/mL — ABNORMAL LOW (ref 30.0–100.0)

## 2018-12-11 MED ORDER — VITAMIN D (ERGOCALCIFEROL) 1.25 MG (50000 UNIT) PO CAPS: 50000.0000 [IU] | ORAL_CAPSULE | ORAL | 0 refills | Status: DC

## 2018-12-11 NOTE — Telephone Encounter (Signed)
Patient returned call to Joy. °

## 2018-12-11 NOTE — Telephone Encounter (Signed)
Left message for call back.

## 2018-12-11 NOTE — Telephone Encounter (Signed)
-----   Message from Verner Chol, CNM sent at 12/11/2018  1:02 PM EST ----- Notify patient her Vitamin D is very low at 19.7 which increases fatigue and depression. Needs Rx 50,000 once every 7 days x 3 months and recheck at 3 months TSH is normal Lipid panel shows borderline elevation of cholesterol at 209 normal <199, Triglycerides elevated at 222 normal <149 HDL is normal at 84, VLDL is elevated at 44 normal <40 LDL is normal at 81 Working on more plant based diet, lean protein and fish with regular exercise Need to recheck in 4 months order placed AST, ALT with liver profile elevated will need recheck stop all OTC Tylenol and Advil use orders placed Kidney and glucose profile normal CBC is normal Added Hgb a1-C will advise once in

## 2018-12-11 NOTE — Telephone Encounter (Signed)
Patient notified of results. See lab 

## 2018-12-12 LAB — HGB A1C W/O EAG: HEMOGLOBIN A1C: 5.2 % (ref 4.8–5.6)

## 2018-12-12 LAB — SPECIMEN STATUS REPORT

## 2018-12-13 ENCOUNTER — Other Ambulatory Visit: Payer: Self-pay | Admitting: Certified Nurse Midwife

## 2018-12-13 ENCOUNTER — Ambulatory Visit
Admission: RE | Admit: 2018-12-13 | Discharge: 2018-12-13 | Disposition: A | Discharge status: Home / Self Care | Payer: BLUE CROSS/BLUE SHIELD | Source: Ambulatory Visit | Attending: Certified Nurse Midwife | Admitting: Certified Nurse Midwife

## 2018-12-13 ENCOUNTER — Ambulatory Visit (INDEPENDENT_AMBULATORY_CARE_PROVIDER_SITE_OTHER): Payer: BLUE CROSS/BLUE SHIELD

## 2018-12-13 VITALS — BP 132/64 | HR 74 | Resp 14 | Ht 66.0 in | Wt 137.0 lb

## 2018-12-13 DIAGNOSIS — Z1231 Encounter for screening mammogram for malignant neoplasm of breast: Secondary | ICD-10-CM

## 2018-12-13 DIAGNOSIS — Z23 Encounter for immunization: Secondary | ICD-10-CM

## 2018-12-13 NOTE — Progress Notes (Signed)
Patient is here for her T-dap injection.  Patient tolerated injection well in Left Deltoid.  Health screening form copied and given to patient at visit.   Routing to provider for final review.

## 2018-12-25 ENCOUNTER — Telehealth: Payer: Self-pay | Admitting: Certified Nurse Midwife

## 2018-12-25 ENCOUNTER — Other Ambulatory Visit: Payer: BLUE CROSS/BLUE SHIELD

## 2018-12-25 NOTE — Telephone Encounter (Signed)
Left message on voicemail regarding missed lab appointment. °

## 2019-02-26 ENCOUNTER — Other Ambulatory Visit: Payer: Self-pay | Admitting: Certified Nurse Midwife

## 2019-02-26 NOTE — Telephone Encounter (Signed)
Per Ms Serita Sheller note, the patient should only use the high dose vit d for 3 months, she then wanted her to switch to 1,000 IU a day and return for lab work in another month. Please inform.

## 2019-02-26 NOTE — Telephone Encounter (Signed)
Medication refill request: Vitamin D Last AEX:  12/10/18 DL Next AEX: 6/57/84 DL Last MMG (if hormonal medication request): 12/13/18 BIRADS1:Neg  Refill authorized: 12/11/18 #[id number]. Today please advise.

## 2019-02-26 NOTE — Telephone Encounter (Signed)
Patient notified

## 2019-03-05 ENCOUNTER — Other Ambulatory Visit: Payer: Self-pay | Admitting: Certified Nurse Midwife

## 2019-03-05 NOTE — Telephone Encounter (Signed)
Patient needs an appointment for a Vitamin D recheck.  Left message to call Kaitlyn at [phone].

## 2019-11-07 ENCOUNTER — Other Ambulatory Visit: Payer: Self-pay

## 2019-11-07 ENCOUNTER — Ambulatory Visit: Payer: BC Managed Care – PPO | Attending: Internal Medicine

## 2019-11-07 DIAGNOSIS — Z20822 Contact with and (suspected) exposure to covid-19: Secondary | ICD-10-CM

## 2019-11-08 LAB — NOVEL CORONAVIRUS, NAA: SARS-CoV-2, NAA: DETECTED — AB

## 2019-12-10 NOTE — Progress Notes (Deleted)
49 y.o. [id number] Married  {Race/ethnicity:17218} Fe here for annual exam.    No LMP recorded. (Menstrual status: IUD).          Sexually active: {yes no:314532}  The current method of family planning is {contraception:315051}.    Exercising: {yes no:314532}  {types:19826} Smoker:  {YES NO:22349}  ROS  Health Maintenance: Pap:  09-27-16 neg HPV HR neg, 12-06-2017 neg HPV HR neg History of Abnormal Pap: yes MMG:  2-24 2020 category c density birads 1:neg Self Breast exams: {YES NO:22349} Colonoscopy:  none BMD:   2018 normal TDaP:  2020 Shingles: not done Pneumonia: not done Hep C and HIV: *** Labs: ***   reports that she has never smoked. She has never used smokeless tobacco. She reports current alcohol use of about 2.0 - 3.0 standard drinks of alcohol per week. She reports that she does not use drugs.  Past Medical History:  Diagnosis Date  . History of abnormal cervical Pap smear    4/15 & 12/17 neg HPV HR+    Past Surgical History:  Procedure Laterality Date  . COLPOSCOPY     6/15 LGSIL & 1/18 squamous mucosa  . INTRAUTERINE DEVICE (IUD) INSERTION     liletta inserted 2016  . TONSILLECTOMY    . WISDOM TOOTH EXTRACTION      Current Outpatient Medications  Medication Sig Dispense Refill  . levonorgestrel (LILETTA, 52 MG,) 18.6 MCG/DAY IUD IUD 1 each by Intrauterine route once.    . Vitamin D, Ergocalciferol, (DRISDOL) 1.25 MG (50000 UT) CAPS capsule Take 1 capsule (50,000 Units total) by mouth every 7 (seven) days. 12 capsule 0   No current facility-administered medications for this visit.    Family History  Problem Relation Age of Onset  . Hypertension Mother   . Osteoporosis Mother   . Hypertension Father   . Osteoporosis Sister     ROS:  Pertinent items are noted in HPI.  Otherwise, a comprehensive ROS was negative.  Exam:   There were no vitals taken for this visit.   Ht Readings from Last 3 Encounters:  12/13/18 5\' 6"  (1.676 m)  12/10/18 5\' 6"  (1.676  m)  12/05/17 5' 5.75" (1.67 m)    General appearance: alert, cooperative and appears stated age Head: Normocephalic, without obvious abnormality, atraumatic Neck: no adenopathy, supple, symmetrical, trachea midline and thyroid {EXAM; THYROID:18604} Lungs: clear to auscultation bilaterally Breasts: {Exam; breast:13139::"normal appearance, no masses or tenderness"} Heart: regular rate and rhythm Abdomen: soft, non-tender; no masses,  no organomegaly Extremities: extremities normal, atraumatic, no cyanosis or edema Skin: Skin color, texture, turgor normal. No rashes or lesions Lymph nodes: Cervical, supraclavicular, and axillary nodes normal. No abnormal inguinal nodes palpated Neurologic: Grossly normal   Pelvic: External genitalia:  no lesions              Urethra:  normal appearing urethra with no masses, tenderness or lesions              Bartholin's and Skene's: normal                 Vagina: normal appearing vagina with normal color and discharge, no lesions              Cervix: {exam; cervix:14595}              Pap taken: {yes no:314532} Bimanual Exam:  Uterus:  {exam; uterus:12215}              Adnexa: {exam; adnexa:12223}  Rectovaginal: Confirms               Anus:  normal sphincter tone, no lesions  Chaperone present: ***  A:  Well Woman with normal exam  P:   Reviewed health and wellness pertinent to exam  Pap smear: {YES NO:22349}  {plan; gyn:5269::"mammogram","pap smear","return annually or prn"}  An After Visit Summary was printed and given to the patient.

## 2019-12-11 ENCOUNTER — Telehealth: Payer: Self-pay

## 2019-12-11 NOTE — Telephone Encounter (Signed)
Left message for patient advised her appointment for tomorrow with Leota Sauers CNM has been cancelled as the office will be closed in the morning due to inclement weather. Asked patient to return call to the office tomorrow to reschedule her appointment.

## 2019-12-12 ENCOUNTER — Ambulatory Visit: Payer: BLUE CROSS/BLUE SHIELD | Admitting: Certified Nurse Midwife

## 2019-12-16 NOTE — Telephone Encounter (Signed)
Spoke with patient. Appointment rescheduled for 12/17/2019 at 11:30 am with Leota Sauers CNM. Patient is agreeable to date and time. Encounter closed.

## 2019-12-17 ENCOUNTER — Other Ambulatory Visit (HOSPITAL_COMMUNITY)
Admission: RE | Admit: 2019-12-17 | Discharge: 2019-12-17 | Disposition: A | Discharge status: Home / Self Care | Payer: BC Managed Care – PPO | Source: Ambulatory Visit | Attending: Certified Nurse Midwife | Admitting: Certified Nurse Midwife

## 2019-12-17 ENCOUNTER — Ambulatory Visit (INDEPENDENT_AMBULATORY_CARE_PROVIDER_SITE_OTHER): Payer: BC Managed Care – PPO | Admitting: Certified Nurse Midwife

## 2019-12-17 ENCOUNTER — Encounter: Payer: Self-pay | Admitting: Certified Nurse Midwife

## 2019-12-17 ENCOUNTER — Other Ambulatory Visit: Payer: Self-pay

## 2019-12-17 VITALS — BP 110/64 | HR 70 | Temp 98.1°F | Resp 16 | Ht 65.75 in | Wt 140.0 lb

## 2019-12-17 DIAGNOSIS — Z124 Encounter for screening for malignant neoplasm of cervix: Secondary | ICD-10-CM | POA: Insufficient documentation

## 2019-12-17 DIAGNOSIS — Z01419 Encounter for gynecological examination (general) (routine) without abnormal findings: Secondary | ICD-10-CM

## 2019-12-17 DIAGNOSIS — Z30431 Encounter for routine checking of intrauterine contraceptive device: Secondary | ICD-10-CM

## 2019-12-17 NOTE — Progress Notes (Signed)
49 y.o. G69P3003 Married  Caucasian Fe here for annual exam. Periods none with IUD. Has noted some hot flashes daily in the past 2 months. IUD working well, due for exchange in 02/2015. Happy with choice. Sees Urgent care if needed. Needs fasting labs, will do with IUD exchange appointment in 02/2020. Daughter doing well in college. No other health issues today.  No LMP recorded. (Menstrual status: IUD).          Sexually active: Yes.    The current method of family planning is IUD.   Liletta Inserted 02/2015 Exercising: Yes.    walking Smoker:  no  Review of Systems  Constitutional: Negative.   HENT: Negative.   Eyes: Negative.   Respiratory: Negative.   Cardiovascular: Negative.   Gastrointestinal: Negative.   Genitourinary: Negative.   Musculoskeletal: Negative.   Skin: Negative.   Neurological: Negative.   Endo/Heme/Allergies: Negative.   Psychiatric/Behavioral: Negative.     Health Maintenance: Pap: 09-27-16 neg HPV HR+,  12-06-17 neg HPV HR neg History of Abnormal Pap: yes MMG:  12-16-2018 category c density birads 1:neg Self Breast exams: no Colonoscopy:  none BMD:   2018 normal TDaP:  2020 Shingles: not done Pneumonia: not done Hep C and HIV: not done Labs: if needed   reports that she has never smoked. She has never used smokeless tobacco. She reports current alcohol use of about 2.0 - 3.0 standard drinks of alcohol per week. She reports that she does not use drugs.  Past Medical History:  Diagnosis Date  . History of abnormal cervical Pap smear    4/15 & 12/17 neg HPV HR+    Past Surgical History:  Procedure Laterality Date  . COLPOSCOPY     6/15 LGSIL & 1/18 squamous mucosa  . INTRAUTERINE DEVICE (IUD) INSERTION     liletta inserted 2016  . TONSILLECTOMY    . WISDOM TOOTH EXTRACTION      Current Outpatient Medications  Medication Sig Dispense Refill  . levonorgestrel (LILETTA, 52 MG,) 18.6 MCG/DAY IUD IUD 1 each by Intrauterine route once.    Marland Kitchen VITAMIN D  PO Take by mouth.     No current facility-administered medications for this visit.    Family History  Problem Relation Age of Onset  . Hypertension Mother   . Osteoporosis Mother   . Hypertension Father   . Osteoporosis Sister     ROS:  Pertinent items are noted in HPI.  Otherwise, a comprehensive ROS was negative.  Exam:   BP 110/64   Pulse 70   Temp 98.1 F (36.7 C) (Skin)   Resp 16   Ht 5' 5.75" (1.67 m)   Wt 140 lb (63.5 kg)   BMI 22.77 kg/m  Height: 5' 5.75" (167 cm) Ht Readings from Last 3 Encounters:  12/17/19 5' 5.75" (1.67 m)  12/13/18 5\' 6"  (1.676 m)  12/10/18 5\' 6"  (1.676 m)    General appearance: alert, cooperative and appears stated age Head: Normocephalic, without obvious abnormality, atraumatic Neck: no adenopathy, supple, symmetrical, trachea midline and thyroid normal to inspection and palpation Lungs: clear to auscultation bilaterally Breasts: normal appearance, no masses or tenderness, No nipple retraction or dimpling, No nipple discharge or bleeding, No axillary or supraclavicular adenopathy Heart: regular rate and rhythm Abdomen: soft, non-tender; no masses,  no organomegaly Extremities: extremities normal, atraumatic, no cyanosis or edema Skin: Skin color, texture, turgor normal. No rashes or lesions Lymph nodes: Cervical, supraclavicular, and axillary nodes normal. No abnormal inguinal nodes palpated Neurologic:  Grossly normal   Pelvic: External genitalia:  no lesions              Urethra:  normal appearing urethra with no masses, tenderness or lesions              Bartholin's and Skene's: normal                 Vagina: normal appearing vagina with normal color and discharge, no lesions              Cervix: no cervical motion tenderness, no lesions and IUD string noted in cervix              Pap taken: Yes.   Bimanual Exam:  Uterus:  normal size, contour, position, consistency, mobility, non-tender and anteverted              Adnexa: normal  adnexa and no mass, fullness, tenderness               Rectovaginal: Confirms               Anus:  normal sphincter tone, no lesions  Chaperone present: yes  A:  Well Woman with normal exam  Contraception IUD Liletta due for replacement 02/2020  History of abnormal pap + HPV  Perimenopausal symptoms    P:   Reviewed health and wellness pertinent to exam  Risks/benefits/warning signs with IUD discussed, patient was to schedule for exchange, she will be called with information  Discussed perimenopausal symptoms and expectations.  Fasting labs at IUD appointment.  Pap smear: yes   counseled on breast self exam, mammography screening, feminine hygiene, menopause, adequate intake of calcium and vitamin D, diet and exercise  return annually or prn  An After Visit Summary was printed and given to the patient.

## 2019-12-17 NOTE — Patient Instructions (Signed)
EXERCISE AND DIET:  We recommended that you start or continue a regular exercise program for good health. Regular exercise means any activity that makes your heart beat faster and makes you sweat.  We recommend exercising at least 30 minutes per day at least 3 days a week, preferably 4 or 5.  We also recommend a diet low in fat and sugar.  Inactivity, poor dietary choices and obesity can cause diabetes, heart attack, stroke, and kidney damage, among others.    ALCOHOL AND SMOKING:  Women should limit their alcohol intake to no more than 7 drinks/beers/glasses of wine (combined, not each!) per week. Moderation of alcohol intake to this level decreases your risk of breast cancer and liver damage. And of course, no recreational drugs are part of a healthy lifestyle.  And absolutely no smoking or even second hand smoke. Most people know smoking can cause heart and lung diseases, but did you know it also contributes to weakening of your bones? Aging of your skin?  Yellowing of your teeth and nails?  CALCIUM AND VITAMIN D:  Adequate intake of calcium and Vitamin D are recommended.  The recommendations for exact amounts of these supplements seem to change often, but generally speaking 600 mg of calcium (either carbonate or citrate) and 800 units of Vitamin D per day seems prudent. Certain women may benefit from higher intake of Vitamin D.  If you are among these women, your doctor will have told you during your visit.    PAP SMEARS:  Pap smears, to check for cervical cancer or precancers,  have traditionally been done yearly, although recent scientific advances have shown that most women can have pap smears less often.  However, every woman still should have a physical exam from her gynecologist every year. It will include a breast check, inspection of the vulva and vagina to check for abnormal growths or skin changes, a visual exam of the cervix, and then an exam to evaluate the size and shape of the uterus and  ovaries.  And after 50 years of age, a rectal exam is indicated to check for rectal cancers. We will also provide age appropriate advice regarding health maintenance, like when you should have certain vaccines, screening for sexually transmitted diseases, bone density testing, colonoscopy, mammograms, etc.   MAMMOGRAMS:  All women over 40 years old should have a yearly mammogram. Many facilities now offer a "3D" mammogram, which may cost around $50 extra out of pocket. If possible,  we recommend you accept the option to have the 3D mammogram performed.  It both reduces the number of women who will be called back for extra views which then turn out to be normal, and it is better than the routine mammogram at detecting truly abnormal areas.    COLONOSCOPY:  Colonoscopy to screen for colon cancer is recommended for all women at age 50.  We know, you hate the idea of the prep.  We agree, BUT, having colon cancer and not knowing it is worse!!  Colon cancer so often starts as a polyp that can be seen and removed at colonscopy, which can quite literally save your life!  And if your first colonoscopy is normal and you have no family history of colon cancer, most women don't have to have it again for 10 years.  Once every ten years, you can do something that may end up saving your life, right?  We will be happy to help you get it scheduled when you are ready.    Be sure to check your insurance coverage so you understand how much it will cost.  It may be covered as a preventative service at no cost, but you should check your particular policy.     Levonorgestrel intrauterine device (IUD) What is this medicine? LEVONORGESTREL IUD (LEE voe nor jes trel) is a contraceptive (birth control) device. The device is placed inside the uterus by a healthcare professional. It is used to prevent pregnancy. This device can also be used to treat heavy bleeding that occurs during your period. This medicine may be used for other  purposes; ask your health care provider or pharmacist if you have questions. COMMON BRAND NAME(S): Kyleena, LILETTA, Mirena, Skyla What should I tell my health care provider before I take this medicine? They need to know if you have any of these conditions:  abnormal Pap smear  cancer of the breast, uterus, or cervix  diabetes  endometritis  genital or pelvic infection now or in the past  have more than one sexual partner or your partner has more than one partner  heart disease  history of an ectopic or tubal pregnancy  immune system problems  IUD in place  liver disease or tumor  problems with blood clots or take blood-thinners  seizures  use intravenous drugs  uterus of unusual shape  vaginal bleeding that has not been explained  an unusual or allergic reaction to levonorgestrel, other hormones, silicone, or polyethylene, medicines, foods, dyes, or preservatives  pregnant or trying to get pregnant  breast-feeding How should I use this medicine? This device is placed inside the uterus by a health care professional. Talk to your pediatrician regarding the use of this medicine in children. Special care may be needed. Overdosage: If you think you have taken too much of this medicine contact a poison control center or emergency room at once. NOTE: This medicine is only for you. Do not share this medicine with others. What if I miss a dose? This does not apply. Depending on the brand of device you have inserted, the device will need to be replaced every 3 to 6 years if you wish to continue using this type of birth control. What may interact with this medicine? Do not take this medicine with any of the following medications:  amprenavir  bosentan  fosamprenavir This medicine may also interact with the following medications:  aprepitant  armodafinil  barbiturate medicines for inducing sleep or treating  seizures  bexarotene  boceprevir  griseofulvin  medicines to treat seizures like carbamazepine, ethotoin, felbamate, oxcarbazepine, phenytoin, topiramate  modafinil  pioglitazone  rifabutin  rifampin  rifapentine  some medicines to treat HIV infection like atazanavir, efavirenz, indinavir, lopinavir, nelfinavir, tipranavir, ritonavir  St. John's wort  warfarin This list may not describe all possible interactions. Give your health care provider a list of all the medicines, herbs, non-prescription drugs, or dietary supplements you use. Also tell them if you smoke, drink alcohol, or use illegal drugs. Some items may interact with your medicine. What should I watch for while using this medicine? Visit your doctor or health care professional for regular check ups. See your doctor if you or your partner has sexual contact with others, becomes HIV positive, or gets a sexual transmitted disease. This product does not protect you against HIV infection (AIDS) or other sexually transmitted diseases. You can check the placement of the IUD yourself by reaching up to the top of your vagina with clean fingers to feel the threads. Do not pull   on the threads. It is a good habit to check placement after each menstrual period. Call your doctor right away if you feel more of the IUD than just the threads or if you cannot feel the threads at all. The IUD may come out by itself. You may become pregnant if the device comes out. If you notice that the IUD has come out use a backup birth control method like condoms and call your health care provider. Using tampons will not change the position of the IUD and are okay to use during your period. This IUD can be safely scanned with magnetic resonance imaging (MRI) only under specific conditions. Before you have an MRI, tell your healthcare provider that you have an IUD in place, and which type of IUD you have in place. What side effects may I notice from  receiving this medicine? Side effects that you should report to your doctor or health care professional as soon as possible:  allergic reactions like skin rash, itching or hives, swelling of the face, lips, or tongue  fever, flu-like symptoms  genital sores  high blood pressure  no menstrual period for 6 weeks during use  pain, swelling, warmth in the leg  pelvic pain or tenderness  severe or sudden headache  signs of pregnancy  stomach cramping  sudden shortness of breath  trouble with balance, talking, or walking  unusual vaginal bleeding, discharge  yellowing of the eyes or skin Side effects that usually do not require medical attention (report to your doctor or health care professional if they continue or are bothersome):  acne  breast pain  change in sex drive or performance  changes in weight  cramping, dizziness, or faintness while the device is being inserted  headache  irregular menstrual bleeding within first 3 to 6 months of use  nausea This list may not describe all possible side effects. Call your doctor for medical advice about side effects. You may report side effects to FDA at 1-800-FDA-1088. Where should I keep my medicine? This does not apply. NOTE: This sheet is a summary. It may not cover all possible information. If you have questions about this medicine, talk to your doctor, pharmacist, or health care provider.  2020 Elsevier/Gold Standard (2018-08-20 13:22:01)  

## 2019-12-19 ENCOUNTER — Telehealth: Payer: Self-pay | Admitting: *Deleted

## 2019-12-19 DIAGNOSIS — Z0000 Encounter for general adult medical examination without abnormal findings: Secondary | ICD-10-CM

## 2019-12-19 LAB — CYTOLOGY - PAP: Diagnosis: NEGATIVE

## 2019-12-19 NOTE — Telephone Encounter (Signed)
Left message to call Noreene Larsson, RN at [facility] [phone].  If I am not in office, patient can also speak with [facility].       Verner Chol, CNM  Leda Min, RN  Patient due for [employer] in 5/16. Please schedule with Dr. Oscar La ( I discussed with patient and she is fine with this.) She is aware she will be called with appointment. She also needs fasting labs at that appointment  Lipid panel, TSH, Vitamin D, CBC, CMP, please schedule

## 2019-12-19 NOTE — Telephone Encounter (Signed)
Spoke with patient. Advised reviewed Liletta exchange with Dr.Jertson and FDA guidelines are now that Lilettas are good for 6 years. Patient is not due for exchange until 02/2021. Patient verbalizes understanding. Aware if she has BTB that is uncomfortable or irregular bleeding can have Liletta exchanged early. Patient scheduled for 12/30/2019 at 10:45 am for fasting lab work. Patient is agreeable to date and time. Orders placed for labs.  Cc: Carmelina Dane, RN Dr.Jertson  Routing to provider and will close encounter.

## 2019-12-19 NOTE — Telephone Encounter (Signed)
Patient is returning a call to [name] or [name].

## 2019-12-30 ENCOUNTER — Other Ambulatory Visit: Payer: BC Managed Care – PPO

## 2020-01-12 ENCOUNTER — Encounter: Payer: Self-pay | Admitting: Certified Nurse Midwife

## 2020-01-13 ENCOUNTER — Other Ambulatory Visit: Payer: Self-pay

## 2020-01-13 ENCOUNTER — Other Ambulatory Visit (INDEPENDENT_AMBULATORY_CARE_PROVIDER_SITE_OTHER): Payer: BC Managed Care – PPO

## 2020-01-13 DIAGNOSIS — Z0000 Encounter for general adult medical examination without abnormal findings: Secondary | ICD-10-CM

## 2020-01-14 ENCOUNTER — Other Ambulatory Visit: Payer: Self-pay | Admitting: Certified Nurse Midwife

## 2020-01-14 DIAGNOSIS — R899 Unspecified abnormal finding in specimens from other organs, systems and tissues: Secondary | ICD-10-CM

## 2020-01-14 LAB — COMPREHENSIVE METABOLIC PANEL
ALT: 23 IU/L (ref 0–32)
AST: 25 IU/L (ref 0–40)
Albumin/Globulin Ratio: 1.7 (ref 1.2–2.2)
Albumin: 4.2 g/dL (ref 3.8–4.8)
Alkaline Phosphatase: 74 IU/L (ref 39–117)
BUN/Creatinine Ratio: 12 (ref 9–23)
BUN: 8 mg/dL (ref 6–24)
Bilirubin Total: 0.4 mg/dL (ref 0.0–1.2)
CO2: 24 mmol/L (ref 20–29)
Calcium: 10.4 mg/dL — ABNORMAL HIGH (ref 8.7–10.2)
Chloride: 100 mmol/L (ref 96–106)
Creatinine, Ser: 0.69 mg/dL (ref 0.57–1.00)
GFR calc Af Amer: 118 mL/min/{1.73_m2} (ref >59)
GFR calc non Af Amer: 103 mL/min/{1.73_m2} (ref >59)
Globulin, Total: 2.5 g/dL (ref 1.5–4.5)
Glucose: 79 mg/dL (ref 65–99)
Potassium: 4.2 mmol/L (ref 3.5–5.2)
Sodium: 138 mmol/L (ref 134–144)
Total Protein: 6.7 g/dL (ref 6.0–8.5)

## 2020-01-14 LAB — VITAMIN D 25 HYDROXY (VIT D DEFICIENCY, FRACTURES): Vit D, 25-Hydroxy: 71.1 ng/mL (ref 30.0–100.0)

## 2020-01-14 LAB — CBC
Hematocrit: 43 % (ref 34.0–46.6)
Hemoglobin: 14.5 g/dL (ref 11.1–15.9)
MCH: 33.5 pg — ABNORMAL HIGH (ref 26.6–33.0)
MCHC: 33.7 g/dL (ref 31.5–35.7)
MCV: 99 fL — ABNORMAL HIGH (ref 79–97)
Platelets: 213 10*3/uL (ref 150–450)
RBC: 4.33 x10E6/uL (ref 3.77–5.28)
RDW: 12 % (ref 11.7–15.4)
WBC: 5.4 10*3/uL (ref 3.4–10.8)

## 2020-01-14 LAB — LIPID PANEL
Chol/HDL Ratio: 2.6 ratio (ref 0.0–4.4)
Cholesterol, Total: 218 mg/dL — ABNORMAL HIGH (ref 100–199)
HDL: 83 mg/dL (ref >39)
LDL Chol Calc (NIH): 110 mg/dL — ABNORMAL HIGH (ref 0–99)
Triglycerides: 148 mg/dL (ref 0–149)
VLDL Cholesterol Cal: 25 mg/dL (ref 5–40)

## 2020-01-14 LAB — TSH: TSH: 3.08 u[IU]/mL (ref 0.450–4.500)

## 2020-01-28 ENCOUNTER — Other Ambulatory Visit (INDEPENDENT_AMBULATORY_CARE_PROVIDER_SITE_OTHER): Payer: BC Managed Care – PPO

## 2020-01-28 ENCOUNTER — Other Ambulatory Visit: Payer: Self-pay

## 2020-01-28 DIAGNOSIS — R899 Unspecified abnormal finding in specimens from other organs, systems and tissues: Secondary | ICD-10-CM

## 2020-01-29 LAB — PTH, INTACT AND CALCIUM
Calcium: 9.8 mg/dL (ref 8.7–10.2)
PTH: 20 pg/mL (ref 15–65)

## 2020-08-17 ENCOUNTER — Other Ambulatory Visit: Payer: Self-pay | Admitting: Certified Nurse Midwife

## 2020-08-17 DIAGNOSIS — Z1231 Encounter for screening mammogram for malignant neoplasm of breast: Secondary | ICD-10-CM

## 2020-08-19 ENCOUNTER — Ambulatory Visit
Admission: RE | Admit: 2020-08-19 | Discharge: 2020-08-19 | Disposition: A | Discharge status: Home / Self Care | Payer: BC Managed Care – PPO | Source: Ambulatory Visit

## 2020-08-19 ENCOUNTER — Other Ambulatory Visit: Payer: Self-pay | Admitting: Obstetrics and Gynecology

## 2020-08-19 ENCOUNTER — Other Ambulatory Visit: Payer: Self-pay

## 2020-08-19 DIAGNOSIS — Z1231 Encounter for screening mammogram for malignant neoplasm of breast: Secondary | ICD-10-CM

## 2020-08-25 ENCOUNTER — Encounter: Payer: Self-pay | Admitting: Dermatology

## 2020-08-25 ENCOUNTER — Ambulatory Visit (INDEPENDENT_AMBULATORY_CARE_PROVIDER_SITE_OTHER): Payer: BC Managed Care – PPO | Admitting: Dermatology

## 2020-08-25 ENCOUNTER — Other Ambulatory Visit: Payer: Self-pay

## 2020-08-25 DIAGNOSIS — L578 Other skin changes due to chronic exposure to nonionizing radiation: Secondary | ICD-10-CM

## 2020-08-25 DIAGNOSIS — L719 Rosacea, unspecified: Secondary | ICD-10-CM | POA: Diagnosis not present

## 2020-08-25 DIAGNOSIS — L539 Erythematous condition, unspecified: Secondary | ICD-10-CM

## 2020-08-25 DIAGNOSIS — L738 Other specified follicular disorders: Secondary | ICD-10-CM | POA: Diagnosis not present

## 2020-08-25 MED ORDER — METRONIDAZOLE 0.75 % EX CREA: TOPICAL_CREAM | Freq: Two times a day (BID) | CUTANEOUS | 2 refills | Status: DC

## 2020-08-25 MED ORDER — DOXYCYCLINE HYCLATE 20 MG PO TABS: 20.0000 mg | ORAL_TABLET | Freq: Two times a day (BID) | ORAL | 2 refills | Status: DC

## 2020-08-25 NOTE — Progress Notes (Addendum)
   New Patient Visit  Subjective  Natalie Weber is a 49 y.o. female who presents for the following: Skin Problem (Check face.  C/O hyperpigmentation. Bumps that won't go away. Uneven skin tone. Uses Neutrogena soap and toner. ).    Objective  Well appearing patient in no apparent distress; mood and affect are within normal limits.  Review of Systems: No other skin or systemic complaints except as noted in HPI or Assessment and Plan.  A focused examination was performed including head, including the scalp, face, neck, nose, ears, eyelids, and lips. Relevant physical exam findings are noted in the Assessment and Plan.  Objective  Head - Anterior (Face): facial erythema with telangiectasias  Objective  jaw, neck: Diffuse erythema with underlying dyspigmentation.   Objective  areas of the face: Scattered small yellowish papules  Assessment & Plan  Rosacea Head - Anterior (Face)  With persistent erythema Recommend BBL, multiple treatments needed for best results  Recommend using CeraVe AM moisturizer or Elta MD clear qam. Start Metrocream 0.75% cream qd/bid Start Doxycycline 20 mg PO bid  Rosacea is a chronic progressive skin condition usually affecting the face of adults. It is treatable but not curable. It sometimes affects the eyes (ocular rosacea) as well. It may respond to topical and/or systemic medication and can flare with stress, sun exposure, alcohol, exercise and some foods.   Doxycycline should be taken with food to prevent nausea. Do not lay down for 30 minutes after taking. Be cautious with sun exposure and use good sun protection while on this medication. Pregnant women should not take this medication.    metroNIDAZOLE (METROCREAM) 0.75 % cream - Head - Anterior (Face)  doxycycline (PERIOSTAT) 20 MG tablet - Head - Anterior (Face)  Actinic skin damage jaw, neck  Recommend BBL, multiple treatments for best result  Will schedule with Boneta Lucks, earliest  appointment.  Recommend daily broad spectrum sunscreen SPF 30+ to sun-exposed areas, reapply every 2 hours as needed. Call for new or changing lesions.   Sebaceous hyperplasia areas of the face  Benign, observe.    Discussed treatment options- ED. Non-covered procedure.  Can try Differin 0.1% gel qhs as tolerated, OTC.  Topical retinoid medications like tretinoin/Retin-A, adapalene/Differin, tazarotene/Fabior, and Epiduo/Epiduo Forte can cause dryness and irritation when first started. Only apply a pea-sized amount to the entire affected area. Avoid applying it around the eyes, edges of mouth and creases at the nose. If you experience irritation, use a good moisturizer first and/or apply the medicine less often. If you are doing well with the medicine, you can increase how often you use it until you are applying every night. Be careful with sun protection while using this medication as it can make you sensitive to the sun. This medicine should not be used by pregnant women.    Return in about 2 months (around 10/25/2020) for recheck rosacea.   I, Lawson Radar, CMA, am acting as scribe for Willeen Niece, MD.  Documentation: I have reviewed the above documentation for accuracy and completeness, and I agree with the above.  Willeen Niece MD

## 2020-08-25 NOTE — Patient Instructions (Addendum)
Doxycycline should be taken with food to prevent nausea. Do not lay down for 30 minutes after taking. Be cautious with sun exposure and use good sun protection while on this medication. Pregnant women should not take this medication.   Recommend daily broad spectrum sunscreen SPF 30+ to sun-exposed areas, reapply every 2 hours as needed. Call for new or changing lesions.  Can use Differin 0.1% gel to face at bedtime.

## 2020-09-06 ENCOUNTER — Other Ambulatory Visit: Payer: Self-pay

## 2020-09-06 ENCOUNTER — Ambulatory Visit (INDEPENDENT_AMBULATORY_CARE_PROVIDER_SITE_OTHER): Payer: Self-pay

## 2020-09-06 DIAGNOSIS — L719 Rosacea, unspecified: Secondary | ICD-10-CM

## 2020-09-06 NOTE — Progress Notes (Signed)
Pt tolerated well. Nice response. jj 

## 2020-10-04 ENCOUNTER — Other Ambulatory Visit: Payer: Self-pay

## 2020-10-04 ENCOUNTER — Ambulatory Visit (INDEPENDENT_AMBULATORY_CARE_PROVIDER_SITE_OTHER): Payer: Self-pay

## 2020-10-04 DIAGNOSIS — I781 Nevus, non-neoplastic: Secondary | ICD-10-CM

## 2020-10-04 DIAGNOSIS — L719 Rosacea, unspecified: Secondary | ICD-10-CM

## 2020-10-04 NOTE — Progress Notes (Signed)
BBL #2. See BBL template for settings. jj

## 2020-11-09 ENCOUNTER — Ambulatory Visit: Payer: BC Managed Care – PPO | Admitting: Dermatology

## 2020-11-24 ENCOUNTER — Other Ambulatory Visit: Payer: Self-pay | Admitting: Dermatology

## 2020-11-24 DIAGNOSIS — L719 Rosacea, unspecified: Secondary | ICD-10-CM

## 2020-12-16 NOTE — Progress Notes (Signed)
50 y.o. G61P3003 Married White or Caucasian Not Hispanic or Latino female here for annual exam.  She has a Arts development officer IUD, placed in 5/16. No cycles.  For several years she has had intermittent vasomotor symptoms. Currently more frequent. Having hot flashes and night sweats many x a week. Not sleeping as well.  Sexually active, no pain.     No LMP recorded. (Menstrual status: IUD).          Sexually active: Yes.    The current method of family planning is IUD.    Exercising: Yes.    walking Smoker:  no  Health Maintenance: Pap:  09-27-16 neg HPV HR+. Colposcopy in 1/18 negative for dysplasia, HPV effect noted.  F/U paps: 12-06-17 neg HPV HR neg, 12/17/19 Neg  History of abnormal Pap:  yes MMG:  08/20/20 Bi-rads 1 neg  BMD:   11/15/16 Osteopenic, T score -1.4. FRAX 2.6/0.1%  Colonoscopy: none  TDaP: 12/13/18  Gardasil: none    reports that she has never smoked. She has never used smokeless tobacco. She reports current alcohol use of about 2.0 - 3.0 standard drinks of alcohol per week. She reports that she does not use drugs. She is an Event organiser. She has 47 year old son, 65 year old daughter and 22 year old daughter. Younger 2 at school at [employer]. Son works for a [employer], lives in [place].  Has a 16 year stepdaughter at home and 24 year old stepson at [employer]. Married x 5 years.   Past Medical History:  Diagnosis Date  . History of abnormal cervical Pap smear    4/15 & 12/17 neg HPV HR+    Past Surgical History:  Procedure Laterality Date  . COLPOSCOPY     6/15 LGSIL & 1/18 squamous mucosa  . INTRAUTERINE DEVICE (IUD) INSERTION     liletta inserted 2016  . TONSILLECTOMY    . WISDOM TOOTH EXTRACTION      Current Outpatient Medications  Medication Sig Dispense Refill  . levonorgestrel (LILETTA) 18.6 MCG/DAY IUD IUD 1 each by Intrauterine route once.    . metroNIDAZOLE (METROCREAM) 0.75 % cream Apply topically in the morning and at bedtime. 45 g 2  . VITAMIN D  PO Take by mouth.     No current facility-administered medications for this visit.    Family History  Problem Relation Age of Onset  . Hypertension Mother   . Osteoporosis Mother   . Hypertension Father   . Osteoporosis Sister   no h/o hip fracture.   Review of Systems  All other systems reviewed and are negative.   Exam:   BP 130/68   Pulse 67   Ht 5\' 7"  (1.702 m)   Wt 140 lb (63.5 kg)   SpO2 99%   BMI 21.93 kg/m   Weight change: @WEIGHTCHANGE @ Height:   Height: 5\' 7"  (170.2 cm)  Ht Readings from Last 3 Encounters:  12/20/20 5\' 7"  (1.702 m)  12/17/19 5' 5.75" (1.67 m)  12/13/18 5\' 6"  (1.676 m)    General appearance: alert, cooperative and appears stated age Head: Normocephalic, without obvious abnormality, atraumatic Neck: no adenopathy, supple, symmetrical, trachea midline and thyroid normal to inspection and palpation Lungs: clear to auscultation bilaterally Cardiovascular: regular rate and rhythm Breasts: normal appearance, no masses or tenderness Abdomen: soft, non-tender; non distended,  no masses,  no organomegaly Extremities: extremities normal, atraumatic, no cyanosis or edema Skin: Skin color, texture, turgor normal. No rashes or lesions Lymph nodes: Cervical, supraclavicular, and  axillary nodes normal. No abnormal inguinal nodes palpated Neurologic: Grossly normal   Pelvic: External genitalia:  no lesions              Urethra:  normal appearing urethra with no masses, tenderness or lesions              Bartholins and Skenes: normal                 Vagina: normal appearing vagina with normal color and discharge, no lesions              Cervix: no lesions and IUD string 4-5 cm               Bimanual Exam:  Uterus:  normal size, contour, position, consistency, mobility, non-tender and anteverted              Adnexa: no mass, fullness, tenderness               Rectovaginal: Confirms               Anus:  normal sphincter tone, no lesions  Claudette Laws  chaperoned for the exam.    1. Well woman exam with routine gynecological exam Discussed breast self exam Discussed calcium and vit D intake Mammogram UTD  2. Vitamin D deficiency - VITAMIN D 25 Hydroxy (Vit-D Deficiency, Fractures)  3. IUD check up Doing well with the liletta, 6 years is up in 5/22.  -Will check an FSH to help in decision of replacing the IUD   4. Screening for cervical cancer - Cytology - PAP with hpv  5. Laboratory exam ordered as part of routine general medical examination - CBC - Comprehensive metabolic panel - Lipid panel  6. Hot flashes - FSH  7. Night sweat - FSH  8. Colon cancer screening - Ambulatory referral to Gastroenterology

## 2020-12-20 ENCOUNTER — Encounter: Payer: Self-pay | Admitting: Obstetrics and Gynecology

## 2020-12-20 ENCOUNTER — Other Ambulatory Visit (HOSPITAL_COMMUNITY)
Admission: RE | Admit: 2020-12-20 | Discharge: 2020-12-20 | Disposition: A | Discharge status: Home / Self Care | Payer: BC Managed Care – PPO | Source: Ambulatory Visit | Attending: Obstetrics and Gynecology | Admitting: Obstetrics and Gynecology

## 2020-12-20 ENCOUNTER — Ambulatory Visit (INDEPENDENT_AMBULATORY_CARE_PROVIDER_SITE_OTHER): Payer: BC Managed Care – PPO | Admitting: Obstetrics and Gynecology

## 2020-12-20 ENCOUNTER — Other Ambulatory Visit: Payer: Self-pay

## 2020-12-20 VITALS — BP 130/68 | HR 67 | Ht 67.0 in | Wt 140.0 lb

## 2020-12-20 DIAGNOSIS — Z124 Encounter for screening for malignant neoplasm of cervix: Secondary | ICD-10-CM | POA: Insufficient documentation

## 2020-12-20 DIAGNOSIS — Z0000 Encounter for general adult medical examination without abnormal findings: Secondary | ICD-10-CM

## 2020-12-20 DIAGNOSIS — Z30431 Encounter for routine checking of intrauterine contraceptive device: Secondary | ICD-10-CM | POA: Diagnosis not present

## 2020-12-20 DIAGNOSIS — E559 Vitamin D deficiency, unspecified: Secondary | ICD-10-CM

## 2020-12-20 DIAGNOSIS — Z01419 Encounter for gynecological examination (general) (routine) without abnormal findings: Secondary | ICD-10-CM

## 2020-12-20 DIAGNOSIS — R232 Flushing: Secondary | ICD-10-CM

## 2020-12-20 DIAGNOSIS — Z1211 Encounter for screening for malignant neoplasm of colon: Secondary | ICD-10-CM

## 2020-12-20 DIAGNOSIS — R61 Generalized hyperhidrosis: Secondary | ICD-10-CM

## 2020-12-21 LAB — CBC
HCT: 41.7 % (ref 35.0–45.0)
Hemoglobin: 14.4 g/dL (ref 11.7–15.5)
MCH: 33.6 pg — ABNORMAL HIGH (ref 27.0–33.0)
MCHC: 34.5 g/dL (ref 32.0–36.0)
MCV: 97.2 fL (ref 80.0–100.0)
MPV: 10.3 fL (ref 7.5–12.5)
Platelets: 240 10*3/uL (ref 140–400)
RBC: 4.29 10*6/uL (ref 3.80–5.10)
RDW: 12.3 % (ref 11.0–15.0)
WBC: 8.5 10*3/uL (ref 3.8–10.8)

## 2020-12-21 LAB — COMPREHENSIVE METABOLIC PANEL
AG Ratio: 1.5 (calc) (ref 1.0–2.5)
ALT: 30 U/L — ABNORMAL HIGH (ref 6–29)
AST: 40 U/L — ABNORMAL HIGH (ref 10–35)
Albumin: 4.3 g/dL (ref 3.6–5.1)
Alkaline phosphatase (APISO): 78 U/L (ref 37–153)
BUN: 12 mg/dL (ref 7–25)
CO2: 27 mmol/L (ref 20–32)
Calcium: 9.9 mg/dL (ref 8.6–10.4)
Chloride: 99 mmol/L (ref 98–110)
Creat: 0.7 mg/dL (ref 0.50–1.05)
Globulin: 2.8 g/dL (calc) (ref 1.9–3.7)
Glucose, Bld: 87 mg/dL (ref 65–99)
Potassium: 4.4 mmol/L (ref 3.5–5.3)
Sodium: 136 mmol/L (ref 135–146)
Total Bilirubin: 0.3 mg/dL (ref 0.2–1.2)
Total Protein: 7.1 g/dL (ref 6.1–8.1)

## 2020-12-21 LAB — LIPID PANEL
Cholesterol: 239 mg/dL — ABNORMAL HIGH (ref <200)
HDL: 85 mg/dL (ref >=50)
LDL Cholesterol (Calc): 105 mg/dL (calc) — ABNORMAL HIGH
Non-HDL Cholesterol (Calc): 154 mg/dL (calc) — ABNORMAL HIGH (ref <130)
Total CHOL/HDL Ratio: 2.8 (calc) (ref <5.0)
Triglycerides: 371 mg/dL — ABNORMAL HIGH (ref <150)

## 2020-12-21 LAB — VITAMIN D 25 HYDROXY (VIT D DEFICIENCY, FRACTURES): Vit D, 25-Hydroxy: 39 ng/mL (ref 30–100)

## 2020-12-21 LAB — FOLLICLE STIMULATING HORMONE: FSH: 67.4 m[IU]/mL

## 2020-12-22 LAB — CYTOLOGY - PAP
Comment: NEGATIVE
Diagnosis: NEGATIVE
High risk HPV: NEGATIVE

## 2021-01-13 DIAGNOSIS — M25531 Pain in right wrist: Secondary | ICD-10-CM | POA: Insufficient documentation

## 2021-07-10 IMAGING — MG DIGITAL SCREENING BILAT W/ TOMO W/ CAD
8 series · 8 of 24 positions shown · non-contrast
Comparison: Previous exam(s).

CLINICAL DATA: Screening.

EXAM:
DIGITAL SCREENING BILATERAL MAMMOGRAM WITH TOMO AND CAD

[L MLO synth-2D]
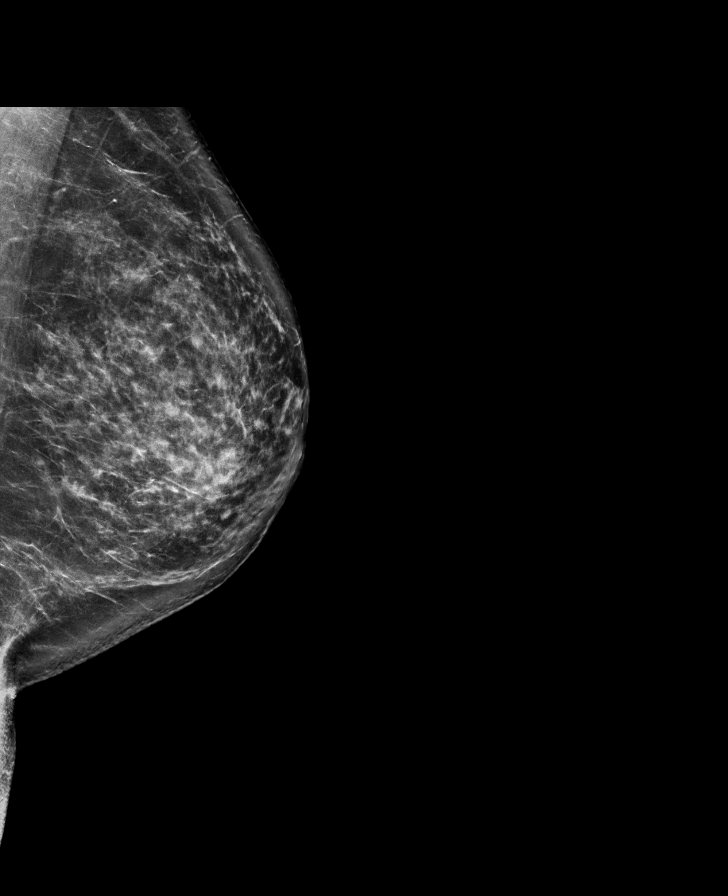

[R CC synth-2D]
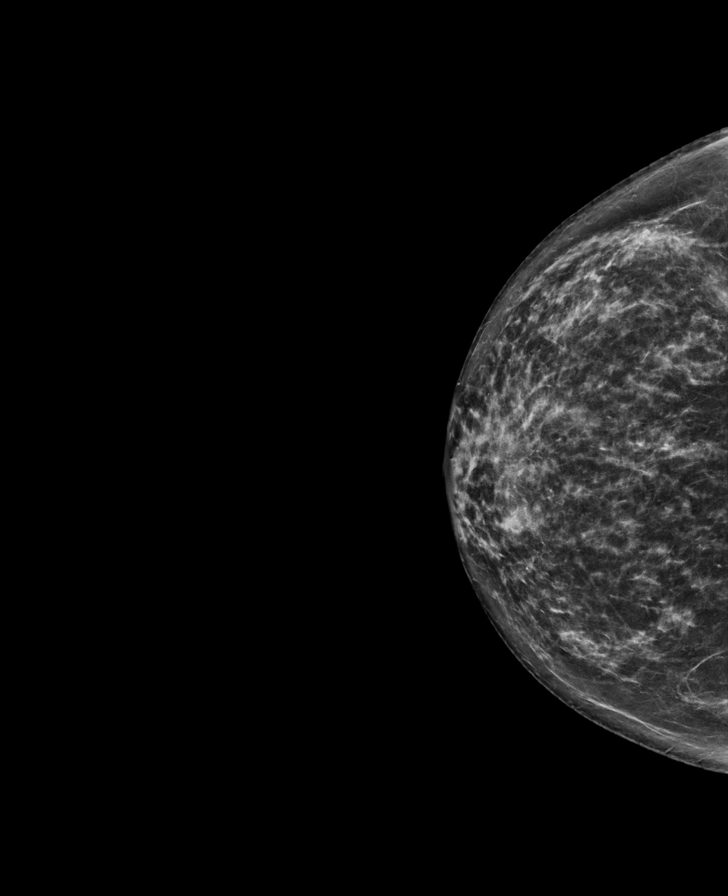

[R MLO synth-2D]
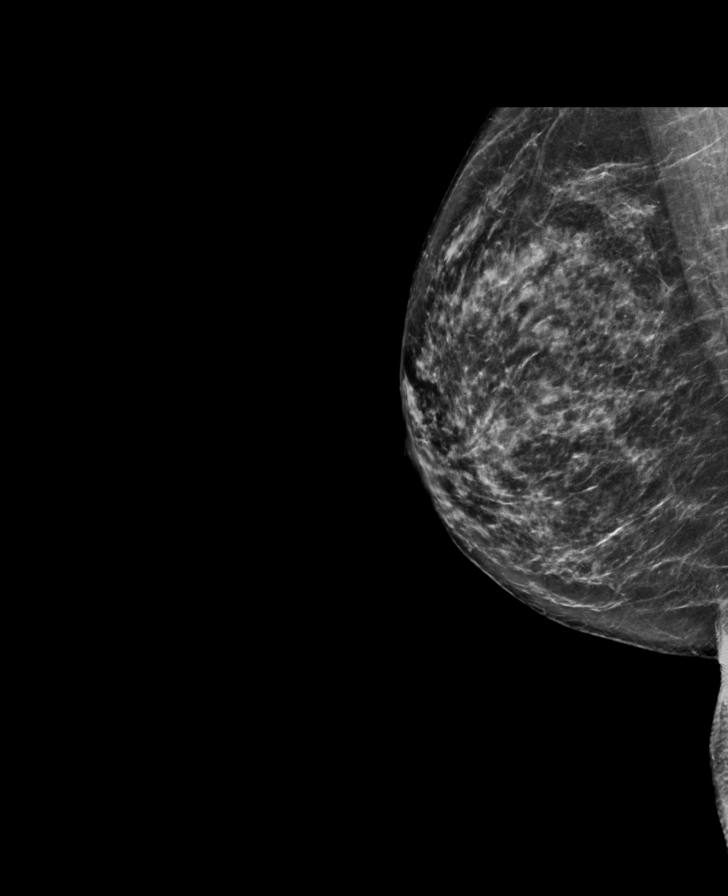

[L CC synth-2D]
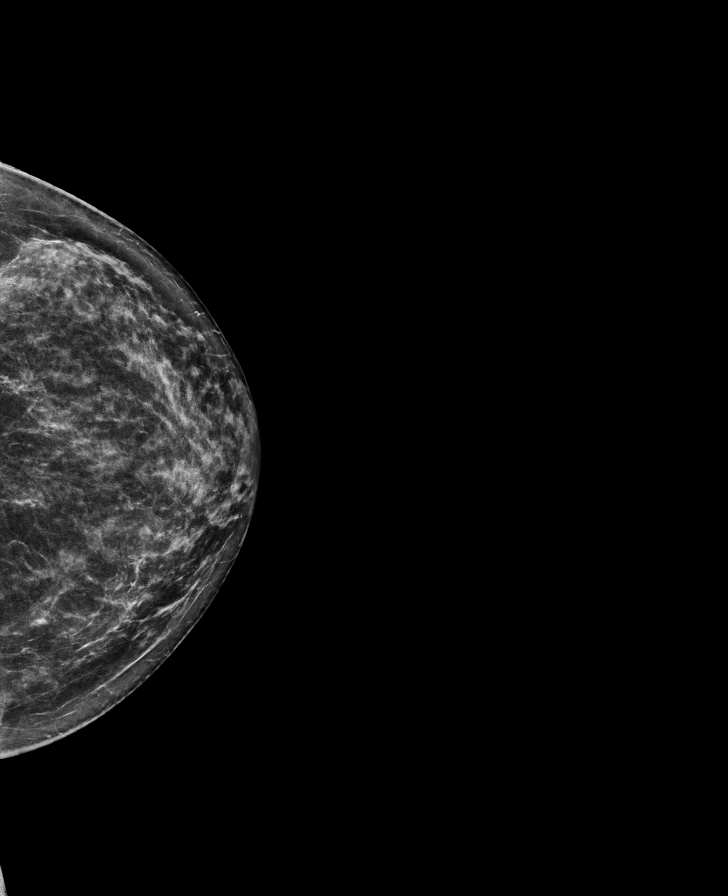

[L CC tomo · tomo slice 40/79.0]
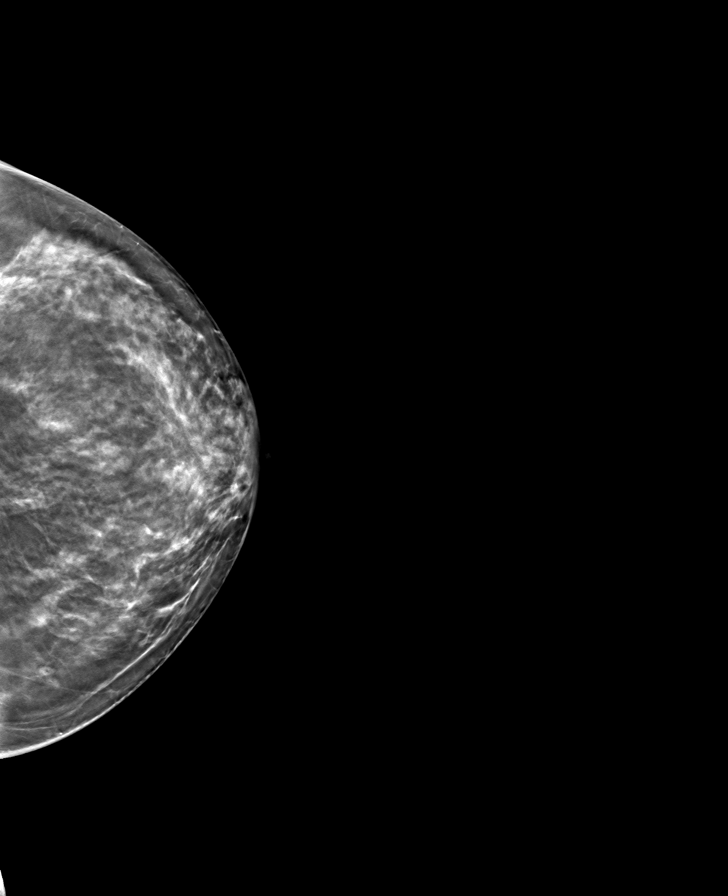

[R CC tomo · tomo slice 39/76.0]
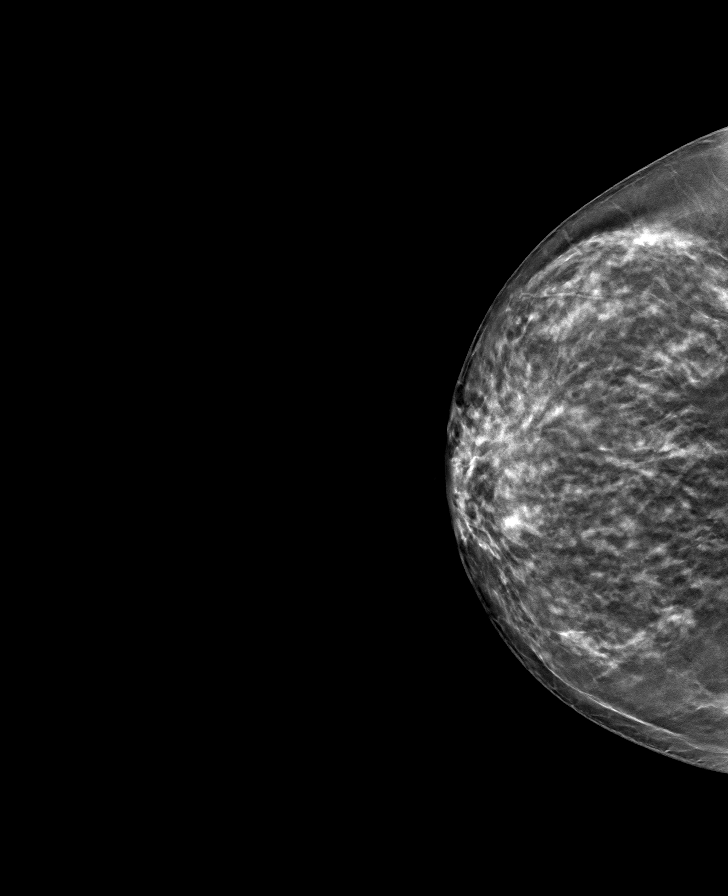

[R MLO tomo · tomo slice 39/78.0]
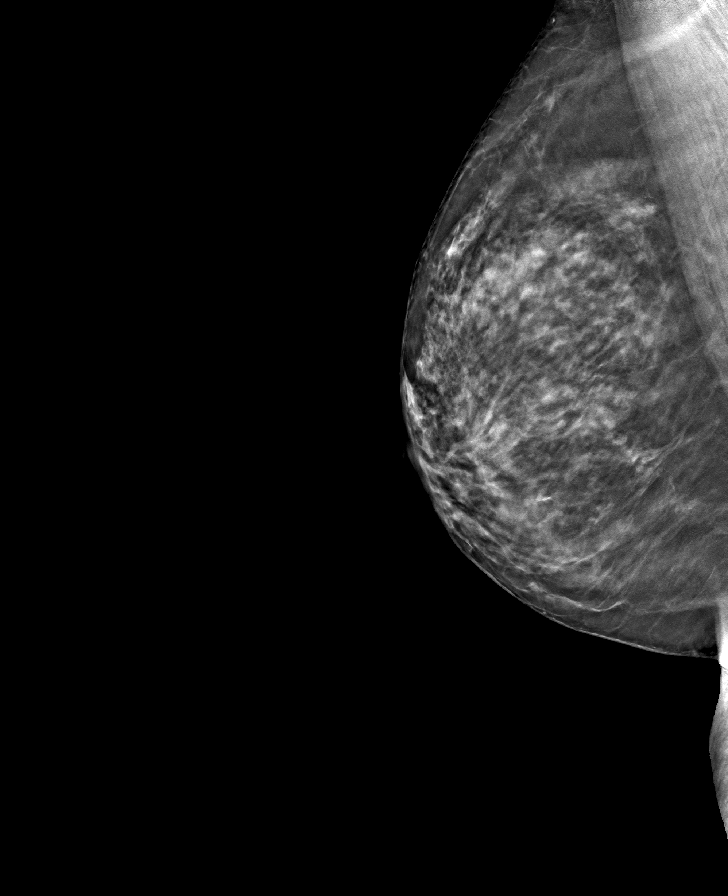

[L MLO tomo · tomo slice 43/86.0]
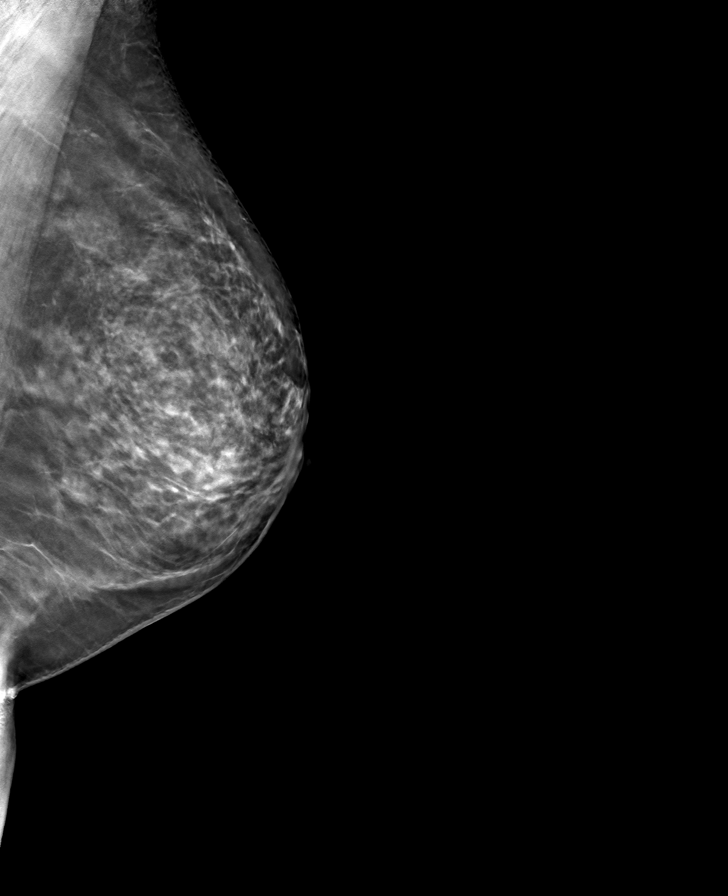

[8 of 24 positions shown; findings below may reference images not displayed]

ACR Breast Density Category c: The breast tissue is heterogeneously
dense, which may obscure small masses.
FINDINGS: There are no findings suspicious for malignancy. Images were
processed with CAD.
IMPRESSION: No mammographic evidence of malignancy. A result letter of this
screening mammogram will be mailed directly to the patient.

RECOMMENDATION:
Screening mammogram in one year. (Code:FT-U-LHB)

BI-RADS CATEGORY  1: Negative.

## 2021-10-04 ENCOUNTER — Other Ambulatory Visit: Payer: Self-pay | Admitting: Dermatology

## 2021-10-04 DIAGNOSIS — L719 Rosacea, unspecified: Secondary | ICD-10-CM

## 2021-12-13 NOTE — Progress Notes (Signed)
51 y.o. G17P3003 Married White or Caucasian Not Hispanic or Latino female here for annual exam.   ?She has a liletta IUD, placed in 5/16. No vaginal bleeding. Sexually active, no pain.  ?FSH from last year was 67. Occasional vasomotor symptoms, improved. ? ?No bowel or bladder c/o.  ?  ? ?No LMP recorded. (Menstrual status: IUD).          ?Sexually active: Yes.    ?The current method of family planning is IUD.  Liletta IUD Placed 5/16  ?Exercising: Yes.     Walking  ?Smoker:  no ? ?Health Maintenance: ?Pap:  12/20/20 WNL, neg hpv; 12/17/19 Neg,  12-06-17 neg HPV HR neg, 09-27-16 neg HPV HR+. Colposcopy in 1/18 negative for dysplasia, HPV effect noted.  ?History of abnormal Pap:  yes ?MMG:  08/20/20 density C Bi-rads 1 neg  ?BMD:   11/15/16 Osteopenic, T score -1.4. FRAX 2.6/0.1%  ?Colonoscopy: none  ?TDaP:  12/13/18  ?Gardasil: none  ? ? reports that she has never smoked. She has never used smokeless tobacco. She reports current alcohol use of about 2.0 - 3.0 standard drinks per week. She reports that she does not use drugs.  She is an Event organiser. ? ?Past Medical History:  ?Diagnosis Date  ? History of abnormal cervical Pap smear   ? 4/15 & 12/17 neg HPV HR+  ? ? ?Past Surgical History:  ?Procedure Laterality Date  ? COLPOSCOPY    ? 6/15 LGSIL & 1/18 squamous mucosa  ? INTRAUTERINE DEVICE (IUD) INSERTION    ? liletta inserted 2016  ? TONSILLECTOMY    ? WISDOM TOOTH EXTRACTION    ? ? ?Current Outpatient Medications  ?Medication Sig Dispense Refill  ? levonorgestrel (LILETTA) 18.6 MCG/DAY IUD IUD 1 each by Intrauterine route once.    ? metroNIDAZOLE (METROCREAM) 0.75 % cream Apply topically in the morning and at bedtime. 45 g 0  ? VITAMIN D PO Take by mouth.    ? ?No current facility-administered medications for this visit.  ? ? ?Family History  ?Problem Relation Age of Onset  ? Hypertension Mother   ? Osteoporosis Mother   ? Hypertension Father   ? Osteoporosis Sister   ? ? ?Review of Systems  ?All other systems  reviewed and are negative. ? ?Exam:   ?BP 110/64   Pulse 76   Ht 5\' 6"  (1.676 m)   Wt 137 lb (62.1 kg)   SpO2 100%   BMI 22.11 kg/m?   Weight change: @WEIGHTCHANGE @ Height:   Height: 5\' 6"  (167.6 cm)  ?Ht Readings from Last 3 Encounters:  ?12/21/21 5\' 6"  (1.676 m)  ?12/20/20 5\' 7"  (1.702 m)  ?12/17/19 5' 5.75" (1.67 m)  ? ? ?General appearance: alert, cooperative and appears stated age ?Head: Normocephalic, without obvious abnormality, atraumatic ?Neck: no adenopathy, supple, symmetrical, trachea midline and thyroid normal to inspection and palpation ?Lungs: clear to auscultation bilaterally ?Cardiovascular: regular rate and rhythm ?Breasts: normal appearance, no masses or tenderness ?Abdomen: soft, non-tender; non distended,  no masses,  no organomegaly ?Extremities: extremities normal, atraumatic, no cyanosis or edema ?Skin: Skin color, texture, turgor normal. No rashes or lesions ?Lymph nodes: Cervical, supraclavicular, and axillary nodes normal. ?No abnormal inguinal nodes palpated ?Neurologic: Grossly normal ? ? ?Pelvic: External genitalia:  no lesions ?             Urethra:  normal appearing urethra with no masses, tenderness or lesions ?  Bartholins and Skenes: normal    ?             Vagina: normal appearing vagina with normal color and discharge, no lesions ?             Cervix: no lesions ?              ?Bimanual Exam:  Uterus:  normal size, contour, position, consistency, mobility, non-tender ?             Adnexa: no mass, fullness, tenderness ?              Rectovaginal: Confirms ?              Anus:  normal sphincter tone, no lesions ? ?Carolynn Serve chaperoned for the exam. ? ?1. Well woman exam ?Discussed breast self exam ?Discussed calcium and vit D intake ?Mammogram overdue, she will schedule ? ?2. Colon cancer screening ?- Ambulatory referral to Gastroenterology ? ?3. Elevated cholesterol ?- Lipid panel; Future ? ?4. Elevated LFTs ?- Comprehensive metabolic panel; Future ? ?5.  Laboratory exam ordered as part of routine general medical examination ?- CBC; Future ?- Comprehensive metabolic panel; Future ?- Lipid panel; Future ? ?6. History of osteopenia ?In 2018, will repeat DEXA in the next few years ? ?7. Vitamin D deficiency ?- VITAMIN D 25 Hydroxy (Vit-D Deficiency, Fractures); Future ? ? ? ? ?

## 2021-12-21 ENCOUNTER — Other Ambulatory Visit: Payer: Self-pay

## 2021-12-21 ENCOUNTER — Ambulatory Visit (INDEPENDENT_AMBULATORY_CARE_PROVIDER_SITE_OTHER): Payer: BC Managed Care – PPO | Admitting: Obstetrics and Gynecology

## 2021-12-21 ENCOUNTER — Encounter: Payer: Self-pay | Admitting: Obstetrics and Gynecology

## 2021-12-21 VITALS — BP 110/64 | HR 76 | Ht 66.0 in | Wt 137.0 lb

## 2021-12-21 DIAGNOSIS — Z8739 Personal history of other diseases of the musculoskeletal system and connective tissue: Secondary | ICD-10-CM

## 2021-12-21 DIAGNOSIS — Z01419 Encounter for gynecological examination (general) (routine) without abnormal findings: Secondary | ICD-10-CM | POA: Diagnosis not present

## 2021-12-21 DIAGNOSIS — Z1211 Encounter for screening for malignant neoplasm of colon: Secondary | ICD-10-CM

## 2021-12-21 DIAGNOSIS — Z0000 Encounter for general adult medical examination without abnormal findings: Secondary | ICD-10-CM

## 2021-12-21 DIAGNOSIS — R7989 Other specified abnormal findings of blood chemistry: Secondary | ICD-10-CM

## 2021-12-21 DIAGNOSIS — E559 Vitamin D deficiency, unspecified: Secondary | ICD-10-CM

## 2021-12-21 DIAGNOSIS — E7800 Pure hypercholesterolemia, unspecified: Secondary | ICD-10-CM | POA: Diagnosis not present

## 2021-12-21 NOTE — Patient Instructions (Signed)

## 2021-12-23 ENCOUNTER — Other Ambulatory Visit: Payer: Self-pay

## 2021-12-23 ENCOUNTER — Other Ambulatory Visit: Payer: BC Managed Care – PPO

## 2021-12-23 DIAGNOSIS — E559 Vitamin D deficiency, unspecified: Secondary | ICD-10-CM

## 2021-12-23 DIAGNOSIS — R7989 Other specified abnormal findings of blood chemistry: Secondary | ICD-10-CM

## 2021-12-23 DIAGNOSIS — Z0000 Encounter for general adult medical examination without abnormal findings: Secondary | ICD-10-CM

## 2021-12-23 DIAGNOSIS — E7800 Pure hypercholesterolemia, unspecified: Secondary | ICD-10-CM

## 2021-12-28 LAB — CBC
HCT: 43.6 % (ref 35.0–45.0)
Hemoglobin: 14.5 g/dL (ref 11.7–15.5)
MCH: 33 pg (ref 27.0–33.0)
MCHC: 33.3 g/dL (ref 32.0–36.0)
MCV: 99.1 fL (ref 80.0–100.0)
MPV: 10.6 fL (ref 7.5–12.5)
Platelets: 234 10*3/uL (ref 140–400)
RBC: 4.4 10*6/uL (ref 3.80–5.10)
RDW: 11.9 % (ref 11.0–15.0)
WBC: 6.9 10*3/uL (ref 3.8–10.8)

## 2021-12-28 LAB — COMPREHENSIVE METABOLIC PANEL
AG Ratio: 1.6 (calc) (ref 1.0–2.5)
ALT: 13 U/L (ref 6–29)
AST: 17 U/L (ref 10–35)
Albumin: 4.5 g/dL (ref 3.6–5.1)
Alkaline phosphatase (APISO): 66 U/L (ref 37–153)
BUN: 9 mg/dL (ref 7–25)
CO2: 24 mmol/L (ref 20–32)
Calcium: 10.6 mg/dL — ABNORMAL HIGH (ref 8.6–10.4)
Chloride: 102 mmol/L (ref 98–110)
Creat: 0.84 mg/dL (ref 0.50–1.03)
Globulin: 2.8 g/dL (calc) (ref 1.9–3.7)
Glucose, Bld: 103 mg/dL — ABNORMAL HIGH (ref 65–99)
Potassium: 5.6 mmol/L — ABNORMAL HIGH (ref 3.5–5.3)
Sodium: 138 mmol/L (ref 135–146)
Total Bilirubin: 0.7 mg/dL (ref 0.2–1.2)
Total Protein: 7.3 g/dL (ref 6.1–8.1)

## 2021-12-28 LAB — HEMOGLOBIN A1C
Hgb A1c MFr Bld: 5.2 % of total Hgb (ref <5.7)
Mean Plasma Glucose: 103 mg/dL
eAG (mmol/L): 5.7 mmol/L

## 2021-12-28 LAB — VITAMIN D 25 HYDROXY (VIT D DEFICIENCY, FRACTURES): Vit D, 25-Hydroxy: 32 ng/mL (ref 30–100)

## 2021-12-28 LAB — LIPID PANEL
Cholesterol: 277 mg/dL — ABNORMAL HIGH (ref <200)
HDL: 68 mg/dL (ref >=50)
LDL Cholesterol (Calc): 167 mg/dL (calc) — ABNORMAL HIGH
Non-HDL Cholesterol (Calc): 209 mg/dL (calc) — ABNORMAL HIGH (ref <130)
Total CHOL/HDL Ratio: 4.1 (calc) (ref <5.0)
Triglycerides: 257 mg/dL — ABNORMAL HIGH (ref <150)

## 2022-02-14 ENCOUNTER — Other Ambulatory Visit: Payer: Self-pay | Admitting: Dermatology

## 2022-02-14 DIAGNOSIS — L719 Rosacea, unspecified: Secondary | ICD-10-CM

## 2022-02-23 ENCOUNTER — Other Ambulatory Visit: Payer: Self-pay | Admitting: Obstetrics and Gynecology

## 2022-02-23 DIAGNOSIS — Z1231 Encounter for screening mammogram for malignant neoplasm of breast: Secondary | ICD-10-CM

## 2022-02-24 ENCOUNTER — Ambulatory Visit: Payer: BC Managed Care – PPO

## 2022-03-21 ENCOUNTER — Ambulatory Visit: Payer: BC Managed Care – PPO

## 2022-07-13 ENCOUNTER — Ambulatory Visit
Admission: RE | Admit: 2022-07-13 | Discharge: 2022-07-13 | Disposition: A | Discharge status: Home / Self Care | Payer: BC Managed Care – PPO | Source: Ambulatory Visit | Attending: Obstetrics and Gynecology | Admitting: Obstetrics and Gynecology

## 2022-07-13 DIAGNOSIS — Z1231 Encounter for screening mammogram for malignant neoplasm of breast: Secondary | ICD-10-CM

## 2022-12-18 NOTE — Progress Notes (Signed)
52 y.o. G3P3003 Married White or Caucasian Not Hispanic or Latino female here for annual exam.  She has a liletta IUD, placed in 5/16.    [place] from 2/22 was 67.4. Her vasomotor symptoms resolved and then came back a little. Sexually active, slight dryness, no pain.   No LMP recorded. (Menstrual status: IUD).          Sexually active: Yes.    The current method of family planning is IUD.    Liletta IUD Placed 5/16   Exercising: Yes.     Walking  Smoker:  no  Health Maintenance: Pap:   12/20/20 WNL, neg hpv; 12/17/19 Neg,  12-06-17 neg HPV HR neg, 09-27-16 neg HPV HR+. Colposcopy in 1/18 negative for dysplasia, HPV effect noted.   History of abnormal Pap:  yes MMG:  07/14/22 density C Bi-rads 1 neg  BMD:      11/15/16 Osteopenic, T score -1.4. FRAX 2.6/0.1%  Colonoscopy: none  TDaP:  12/13/18  Gardasil: none    reports that she has never smoked. She has never used smokeless tobacco. She reports current alcohol use of about 2.0 - 3.0 standard drinks of alcohol per week. She reports that she does not use drugs. She is an Chief Executive Officer.   Past Medical History:  Diagnosis Date   History of abnormal cervical Pap smear    4/15 & 12/17 neg HPV HR+    Past Surgical History:  Procedure Laterality Date   COLPOSCOPY     6/15 LGSIL & 1/18 squamous mucosa   INTRAUTERINE DEVICE (IUD) INSERTION     liletta inserted 2016   TONSILLECTOMY     WISDOM TOOTH EXTRACTION      Current Outpatient Medications  Medication Sig Dispense Refill   levonorgestrel (LILETTA) 18.6 MCG/DAY IUD IUD 1 each by Intrauterine route once.     VITAMIN D PO Take by mouth.     No current facility-administered medications for this visit.    Family History  Problem Relation Age of Onset   Hypertension Mother    Osteoporosis Mother    Hypertension Father    Osteoporosis Sister     Review of Systems  All other systems reviewed and are negative.   Exam:   BP 124/72   Pulse 68   Ht 5' 5.5" (1.664 m)   Wt 137  lb (62.1 kg)   SpO2 100%   BMI 22.45 kg/m   Weight change: '@WEIGHTCHANGE'$ @ Height:   Height: 5' 5.5" (166.4 cm)  Ht Readings from Last 3 Encounters:  12/27/22 5' 5.5" (1.664 m)  12/21/21 '5\' 6"'$  (1.676 m)  12/20/20 '5\' 7"'$  (1.702 m)    General appearance: alert, cooperative and appears stated age Head: Normocephalic, without obvious abnormality, atraumatic Neck: no adenopathy, supple, symmetrical, trachea midline and thyroid normal to inspection and palpation Lungs: clear to auscultation bilaterally Cardiovascular: regular rate and rhythm Breasts: normal appearance, no masses or tenderness Abdomen: soft, non-tender; non distended,  no masses,  no organomegaly Extremities: extremities normal, atraumatic, no cyanosis or edema Skin: Skin color, texture, turgor normal. No rashes or lesions Lymph nodes: Cervical, supraclavicular, and axillary nodes normal. No abnormal inguinal nodes palpated Neurologic: Grossly normal   Pelvic: External genitalia:  no lesions              Urethra:  normal appearing urethra with no masses, tenderness or lesions              Bartholins and Skenes: normal  Vagina: mildly atrophic appearing vagina with normal color and discharge, no lesions              Cervix: no lesions and IUD strings 4-5 cm, IUD removed with ringed forceps               Bimanual Exam:  Uterus:  normal size, contour, position, consistency, mobility, non-tender              Adnexa: no mass, fullness, tenderness               Rectovaginal: Confirms               Anus:  normal sphincter tone, no lesions  Gae Dry, CMA chaperoned for the exam.  1. Well woman exam Discussed breast self exam Discussed calcium and vit D intake She is going to establish care with a primary and get her lab work there  2. History of osteopenia Getting calcium and vit D - DG Bone Density; Future  3. Hypoestrogenism - DG Bone Density; Future  4. Amenorrhea Elevated FSH 2 years ago,  suspect she is menopausal - Follicle stimulating hormone  5. Encounter for IUD removal IUD removed, will see Muddy prior to making further recommendations for contraception  6. Colon cancer screening - Ambulatory referral to Gastroenterology

## 2022-12-27 ENCOUNTER — Ambulatory Visit (INDEPENDENT_AMBULATORY_CARE_PROVIDER_SITE_OTHER): Payer: BC Managed Care – PPO | Admitting: Obstetrics and Gynecology

## 2022-12-27 ENCOUNTER — Encounter: Payer: Self-pay | Admitting: Obstetrics and Gynecology

## 2022-12-27 VITALS — BP 124/72 | HR 68 | Ht 65.5 in | Wt 137.0 lb

## 2022-12-27 DIAGNOSIS — Z8739 Personal history of other diseases of the musculoskeletal system and connective tissue: Secondary | ICD-10-CM

## 2022-12-27 DIAGNOSIS — N912 Amenorrhea, unspecified: Secondary | ICD-10-CM

## 2022-12-27 DIAGNOSIS — Z30432 Encounter for removal of intrauterine contraceptive device: Secondary | ICD-10-CM

## 2022-12-27 DIAGNOSIS — E2839 Other primary ovarian failure: Secondary | ICD-10-CM

## 2022-12-27 DIAGNOSIS — Z01419 Encounter for gynecological examination (general) (routine) without abnormal findings: Secondary | ICD-10-CM | POA: Diagnosis not present

## 2022-12-27 DIAGNOSIS — Z1211 Encounter for screening for malignant neoplasm of colon: Secondary | ICD-10-CM

## 2022-12-28 LAB — FOLLICLE STIMULATING HORMONE: FSH: 54 m[IU]/mL

## 2023-01-16 ENCOUNTER — Telehealth: Payer: Self-pay

## 2023-01-16 DIAGNOSIS — M8580 Other specified disorders of bone density and structure, unspecified site: Secondary | ICD-10-CM

## 2023-01-16 NOTE — Telephone Encounter (Signed)
[name] from [facility] calling to request DEXA order be signed by provider due to pt's appt being scheduled for tomorrow 01/17/2023.  Msg sent to JJ to sign at her earliest convenience.

## 2023-01-17 NOTE — Telephone Encounter (Signed)
Per appt note: Pt cancelled appt for DEXA for "work event."

## 2023-01-18 ENCOUNTER — Inpatient Hospital Stay: Admission: RE | Admit: 2023-01-18 | Payer: BC Managed Care – PPO | Source: Ambulatory Visit

## 2023-01-18 ENCOUNTER — Other Ambulatory Visit: Payer: BC Managed Care – PPO

## 2023-01-25 NOTE — Telephone Encounter (Signed)
FYI. F/u w/ pt to see if she needed any assistance in getting this r/s. She declined assistance and states she will get this r/s at her earliest convenience. Keep open or ok to close? Please advise.

## 2023-04-30 ENCOUNTER — Ambulatory Visit: Payer: BC Managed Care – PPO | Admitting: Dermatology

## 2023-05-23 ENCOUNTER — Ambulatory Visit: Payer: BC Managed Care – PPO | Admitting: Dermatology

## 2023-11-06 ENCOUNTER — Other Ambulatory Visit: Payer: Self-pay | Admitting: Radiology

## 2023-11-06 DIAGNOSIS — M8580 Other specified disorders of bone density and structure, unspecified site: Secondary | ICD-10-CM

## 2023-11-06 DIAGNOSIS — Z1231 Encounter for screening mammogram for malignant neoplasm of breast: Secondary | ICD-10-CM

## 2023-11-07 ENCOUNTER — Ambulatory Visit
Admission: RE | Admit: 2023-11-07 | Discharge: 2023-11-07 | Disposition: A | Discharge status: Home / Self Care | Payer: BC Managed Care – PPO | Source: Ambulatory Visit | Attending: Radiology | Admitting: Radiology

## 2023-11-07 DIAGNOSIS — Z1231 Encounter for screening mammogram for malignant neoplasm of breast: Secondary | ICD-10-CM

## 2023-11-07 DIAGNOSIS — M8580 Other specified disorders of bone density and structure, unspecified site: Secondary | ICD-10-CM

## 2023-12-28 ENCOUNTER — Ambulatory Visit: Payer: BC Managed Care – PPO | Admitting: Radiology

## 2024-01-03 ENCOUNTER — Encounter: Payer: Self-pay | Admitting: Radiology

## 2024-01-03 ENCOUNTER — Ambulatory Visit (INDEPENDENT_AMBULATORY_CARE_PROVIDER_SITE_OTHER): Admitting: Radiology

## 2024-01-03 ENCOUNTER — Other Ambulatory Visit (HOSPITAL_COMMUNITY)
Admission: RE | Admit: 2024-01-03 | Discharge: 2024-01-03 | Disposition: A | Discharge status: Home / Self Care | Source: Ambulatory Visit | Attending: Radiology | Admitting: Radiology

## 2024-01-03 VITALS — BP 120/76 | Ht 65.25 in | Wt 138.6 lb

## 2024-01-03 DIAGNOSIS — Z01419 Encounter for gynecological examination (general) (routine) without abnormal findings: Secondary | ICD-10-CM | POA: Diagnosis not present

## 2024-01-03 DIAGNOSIS — Z1331 Encounter for screening for depression: Secondary | ICD-10-CM

## 2024-01-03 DIAGNOSIS — Z1211 Encounter for screening for malignant neoplasm of colon: Secondary | ICD-10-CM

## 2024-01-03 DIAGNOSIS — E7800 Pure hypercholesterolemia, unspecified: Secondary | ICD-10-CM

## 2024-01-03 DIAGNOSIS — Z79890 Hormone replacement therapy (postmenopausal): Secondary | ICD-10-CM

## 2024-01-03 DIAGNOSIS — Z8739 Personal history of other diseases of the musculoskeletal system and connective tissue: Secondary | ICD-10-CM | POA: Diagnosis not present

## 2024-01-03 MED ORDER — PROGESTERONE MICRONIZED 100 MG PO CAPS: 100.0000 mg | ORAL_CAPSULE | Freq: Every day | ORAL | 11 refills | Status: AC

## 2024-01-03 MED ORDER — ESTRADIOL 0.05 MG/24HR TD PTTW: 1.0000 | MEDICATED_PATCH | TRANSDERMAL | 12 refills | Status: DC

## 2024-01-03 MED ORDER — ESTRADIOL 0.05 MG/24HR TD PTTW
1.0000 | MEDICATED_PATCH | TRANSDERMAL | 12 refills | Status: AC
Start: 2024-01-03 — End: ?

## 2024-01-03 NOTE — Progress Notes (Signed)
 Natalie Weber [birth year] [id number]   History: Postmenopausal 53 y.o. presents for annual exam. Doing well. Mother passed away this summer after a fall and hip fx. Hx of osteoporosis and alzheimer's.    Gynecologic History Postmenopausal Last Pap: 2022. Results were: normal Last mammogram: 1/25. Results were: normal Last colonoscopy: never DEXA:osteopenia   Obstetric History OB History  Gravida Para Term Preterm AB Living  3 3 3   3   SAB IAB Ectopic Multiple Live Births      3    # Outcome Date GA Lbr Len/2nd Weight Sex Type Anes PTL Lv  3 Term     F Vag-Spont   LIV  2 Term     F Vag-Spont   LIV  1 Term     M Vag-Spont   LIV       01/03/2024    4:03 PM  Depression screen PHQ 2/9  Decreased Interest 0  Down, Depressed, Hopeless 0  PHQ - 2 Score 0     The following portions of the patient's history were reviewed and updated as appropriate: allergies, current medications, past family history, past medical history, past social history, past surgical history, and problem list.  Review of Systems Pertinent items noted in HPI and remainder of comprehensive ROS otherwise negative.  Past medical history, past surgical history, family history and social history were all reviewed and documented in the EPIC chart.  Exam:  Vitals:   01/03/24 1601  BP: 120/76  Weight: 138 lb 9.6 oz (62.9 kg)  Height: 5' 5.25" (1.657 m)   Body mass index is 22.89 kg/m.  General appearance:  Normal Thyroid:  Symmetrical, normal in size, without palpable masses or nodularity. Respiratory  Auscultation:  Clear without wheezing or rhonchi Cardiovascular  Auscultation:  Regular rate, without rubs, murmurs or gallops  Edema/varicosities:  Not grossly evident Abdominal  Soft,nontender, without masses, guarding or rebound.  Liver/spleen:  No organomegaly noted  Hernia:  None appreciated  Skin  Inspection:  Grossly normal Breasts: Examined lying and sitting.   Right: Without  masses, retractions, nipple discharge or axillary adenopathy.   Left: Without masses, retractions, nipple discharge or axillary adenopathy. Genitourinary   Inguinal/mons:  Normal without inguinal adenopathy  External genitalia:  Normal appearing vulva with no masses, tenderness, or lesions  BUS/Urethra/Skene's glands:  Normal  Vagina:  Normal appearing with normal color and discharge, no lesions.   Cervix:  Normal appearing without discharge or lesions  Uterus:  Normal in size, shape and contour.  Midline and mobile, nontender  Adnexa/parametria:     Rt: Normal in size, without masses or tenderness.   Lt: Normal in size, without masses or tenderness.  Anus and perineum: Normal    Raynelle Fanning, CMA present for exam  Assessment/Plan:   1. Well woman exam with routine gynecological exam (Primary) - Cytology - PAP( [facility]) - Lipid Profile; Future - HgB A1c; Future - CBC; Future - Comp Met (CMET); Future  2. Colon cancer screening - Cologuard  3. History of osteopenia - Vitamin D (25 hydroxy); Future  4. Elevated cholesterol - Lipid Profile; Future  5. Hormone replacement therapy (HRT) Open to HRT to  help with bone density and menopausal symptom management - progesterone (PROMETRIUM) 100 MG capsule; Take 1 capsule (100 mg total) by mouth daily.  Dispense: 30 capsule; Refill: 11 - estradiol (VIVELLE-DOT) 0.05 MG/24HR patch; Place 1 patch (0.05 mg total) onto the skin 2 (two) times a week.  Dispense: 8 patch; Refill:  12    Return in 1 year for annual or sooner prn.  Tanda Rockers WHNP-BC, 4:35 PM 01/03/2024

## 2024-01-07 LAB — CYTOLOGY - PAP
Comment: NEGATIVE
Diagnosis: NEGATIVE
High risk HPV: NEGATIVE

## 2024-01-08 ENCOUNTER — Other Ambulatory Visit

## 2024-01-08 DIAGNOSIS — Z01419 Encounter for gynecological examination (general) (routine) without abnormal findings: Secondary | ICD-10-CM

## 2024-01-08 DIAGNOSIS — E7800 Pure hypercholesterolemia, unspecified: Secondary | ICD-10-CM

## 2024-01-08 DIAGNOSIS — Z8739 Personal history of other diseases of the musculoskeletal system and connective tissue: Secondary | ICD-10-CM

## 2024-01-09 LAB — COMPLETE METABOLIC PANEL WITH GFR
AG Ratio: 1.8 (calc) (ref 1.0–2.5)
ALT: 30 U/L — ABNORMAL HIGH (ref 6–29)
AST: 31 U/L (ref 10–35)
Albumin: 4.8 g/dL (ref 3.6–5.1)
Alkaline phosphatase (APISO): 74 U/L (ref 37–153)
BUN: 9 mg/dL (ref 7–25)
CO2: 29 mmol/L (ref 20–32)
Calcium: 10.6 mg/dL — ABNORMAL HIGH (ref 8.6–10.4)
Chloride: 99 mmol/L (ref 98–110)
Creat: 0.76 mg/dL (ref 0.50–1.03)
Globulin: 2.7 g/dL (ref 1.9–3.7)
Glucose, Bld: 103 mg/dL — ABNORMAL HIGH (ref 65–99)
Potassium: 4.4 mmol/L (ref 3.5–5.3)
Sodium: 140 mmol/L (ref 135–146)
Total Bilirubin: 1 mg/dL (ref 0.2–1.2)
Total Protein: 7.5 g/dL (ref 6.1–8.1)

## 2024-01-09 LAB — LIPID PANEL
Cholesterol: 277 mg/dL — ABNORMAL HIGH (ref <200)
HDL: 94 mg/dL (ref >=50)
LDL Cholesterol (Calc): 141 mg/dL — ABNORMAL HIGH
Non-HDL Cholesterol (Calc): 183 mg/dL — ABNORMAL HIGH (ref <130)
Total CHOL/HDL Ratio: 2.9 (calc) (ref <5.0)
Triglycerides: 271 mg/dL — ABNORMAL HIGH (ref <150)

## 2024-01-09 LAB — CBC
HCT: 41.9 % (ref 35.0–45.0)
Hemoglobin: 14.1 g/dL (ref 11.7–15.5)
MCH: 32.6 pg (ref 27.0–33.0)
MCHC: 33.7 g/dL (ref 32.0–36.0)
MCV: 96.8 fL (ref 80.0–100.0)
MPV: 10 fL (ref 7.5–12.5)
Platelets: 179 10*3/uL (ref 140–400)
RBC: 4.33 10*6/uL (ref 3.80–5.10)
RDW: 12.5 % (ref 11.0–15.0)
WBC: 4.8 10*3/uL (ref 3.8–10.8)

## 2024-01-09 LAB — HEMOGLOBIN A1C
Hgb A1c MFr Bld: 5.4 %{Hb} (ref <5.7)
Mean Plasma Glucose: 108 mg/dL
eAG (mmol/L): 6 mmol/L

## 2024-01-09 LAB — VITAMIN D 25 HYDROXY (VIT D DEFICIENCY, FRACTURES): Vit D, 25-Hydroxy: 52 ng/mL (ref 30–100)

## 2024-02-15 LAB — COLOGUARD: COLOGUARD: NEGATIVE

## 2025-01-07 ENCOUNTER — Ambulatory Visit: Admitting: Radiology
